# Patient Record
Sex: Male | Born: 1950 | Race: White | Hispanic: No | Smoking: Never smoker
Health system: Southern US, Community
[De-identification: ages and names within clinical notes are randomized; demographics above are authoritative.]

## PROBLEM LIST (undated history)

## (undated) DIAGNOSIS — M179 Osteoarthritis of knee, unspecified: Secondary | ICD-10-CM

## (undated) DIAGNOSIS — C61 Malignant neoplasm of prostate: Secondary | ICD-10-CM

## (undated) DIAGNOSIS — N400 Enlarged prostate without lower urinary tract symptoms: Secondary | ICD-10-CM

## (undated) DIAGNOSIS — M1710 Unilateral primary osteoarthritis, unspecified knee: Secondary | ICD-10-CM

## (undated) DIAGNOSIS — E785 Hyperlipidemia, unspecified: Secondary | ICD-10-CM

## (undated) DIAGNOSIS — K644 Residual hemorrhoidal skin tags: Secondary | ICD-10-CM

## (undated) DIAGNOSIS — I10 Essential (primary) hypertension: Secondary | ICD-10-CM

## (undated) HISTORY — DX: Osteoarthritis of knee, unspecified: M17.9

## (undated) HISTORY — PX: TONSILLECTOMY AND ADENOIDECTOMY: SUR1326

## (undated) HISTORY — DX: Residual hemorrhoidal skin tags: K64.4

## (undated) HISTORY — DX: Essential (primary) hypertension: I10

## (undated) HISTORY — DX: Benign prostatic hyperplasia without lower urinary tract symptoms: N40.0

## (undated) HISTORY — DX: Unilateral primary osteoarthritis, unspecified knee: M17.10

## (undated) HISTORY — PX: JOINT REPLACEMENT: SHX530

## (undated) HISTORY — PX: PROSTATE BIOPSY: SHX241

## (undated) HISTORY — PX: OTHER SURGICAL HISTORY: SHX169

## (undated) HISTORY — DX: Hyperlipidemia, unspecified: E78.5

---

## 2004-07-23 HISTORY — PX: OTHER SURGICAL HISTORY: SHX169

## 2006-03-08 ENCOUNTER — Ambulatory Visit (HOSPITAL_COMMUNITY): Admission: RE | Admit: 2006-03-08 | Discharge: 2006-03-08 | Payer: Self-pay | Admitting: Neurosurgery

## 2007-07-30 ENCOUNTER — Ambulatory Visit: Payer: Self-pay | Admitting: Family Medicine

## 2007-07-30 DIAGNOSIS — Z87448 Personal history of other diseases of urinary system: Secondary | ICD-10-CM | POA: Insufficient documentation

## 2007-07-30 DIAGNOSIS — M1990 Unspecified osteoarthritis, unspecified site: Secondary | ICD-10-CM | POA: Insufficient documentation

## 2007-07-30 LAB — CONVERTED CEMR LAB
ALT: 28 units/L (ref 0–53)
AST: 30 units/L (ref 0–37)
Basophils Relative: 0.3 % (ref 0.0–1.0)
Bilirubin, Direct: 0.2 mg/dL (ref 0.0–0.3)
CO2: 30 meq/L (ref 19–32)
Calcium: 9.8 mg/dL (ref 8.4–10.5)
Chloride: 103 meq/L (ref 96–112)
Creatinine, Ser: 0.9 mg/dL (ref 0.4–1.5)
Eosinophils Relative: 4.8 % (ref 0.0–5.0)
GFR calc Af Amer: 112 mL/min
Glucose, Bld: 93 mg/dL (ref 70–99)
HDL: 43.9 mg/dL (ref >39.0)
Lymphocytes Relative: 29.1 % (ref 12.0–46.0)
Neutro Abs: 3.3 10*3/uL (ref 1.4–7.7)
Platelets: 253 10*3/uL (ref 150–400)
Total Bilirubin: 1.3 mg/dL — ABNORMAL HIGH (ref 0.3–1.2)
Total Protein: 7.1 g/dL (ref 6.0–8.3)
VLDL: 29 mg/dL (ref 0–40)
WBC: 6.1 10*3/uL (ref 4.5–10.5)

## 2007-08-04 ENCOUNTER — Telehealth: Payer: Self-pay | Admitting: Family Medicine

## 2007-08-05 ENCOUNTER — Ambulatory Visit: Payer: Self-pay | Admitting: Internal Medicine

## 2007-08-19 ENCOUNTER — Ambulatory Visit: Payer: Self-pay | Admitting: Internal Medicine

## 2007-08-19 ENCOUNTER — Encounter: Payer: Self-pay | Admitting: Family Medicine

## 2007-08-19 DIAGNOSIS — K644 Residual hemorrhoidal skin tags: Secondary | ICD-10-CM | POA: Insufficient documentation

## 2007-11-10 ENCOUNTER — Ambulatory Visit: Payer: Self-pay | Admitting: Family Medicine

## 2007-11-10 LAB — CONVERTED CEMR LAB
Bilirubin, Direct: 0.1 mg/dL (ref 0.0–0.3)
LDL Cholesterol: 112 mg/dL — ABNORMAL HIGH (ref 0–99)
Total Bilirubin: 1 mg/dL (ref 0.3–1.2)
Total CHOL/HDL Ratio: 4.9
VLDL: 30 mg/dL (ref 0–40)

## 2007-12-09 ENCOUNTER — Telehealth: Payer: Self-pay | Admitting: Family Medicine

## 2009-01-31 ENCOUNTER — Ambulatory Visit: Payer: Self-pay | Admitting: Internal Medicine

## 2009-02-02 ENCOUNTER — Encounter (INDEPENDENT_AMBULATORY_CARE_PROVIDER_SITE_OTHER): Payer: Self-pay | Admitting: *Deleted

## 2009-02-02 LAB — CONVERTED CEMR LAB
Alkaline Phosphatase: 62 units/L (ref 39–117)
BUN: 18 mg/dL (ref 6–23)
Bilirubin, Direct: 0.1 mg/dL (ref 0.0–0.3)
Chloride: 107 meq/L (ref 96–112)
Cholesterol: 208 mg/dL — ABNORMAL HIGH (ref 0–200)
Direct LDL: 145.6 mg/dL
Eosinophils Relative: 4 % (ref 0.0–5.0)
Glucose, Bld: 95 mg/dL (ref 70–99)
HCT: 43.3 % (ref 39.0–52.0)
Hemoglobin, Urine: NEGATIVE
Hemoglobin: 14.9 g/dL (ref 13.0–17.0)
Lymphs Abs: 1.5 10*3/uL (ref 0.7–4.0)
MCV: 90.4 fL (ref 78.0–100.0)
Monocytes Absolute: 0.6 10*3/uL (ref 0.1–1.0)
Neutro Abs: 3.1 10*3/uL (ref 1.4–7.7)
Nitrite: NEGATIVE
Platelets: 204 10*3/uL (ref 150.0–400.0)
Potassium: 4.3 meq/L (ref 3.5–5.1)
RDW: 12.8 % (ref 11.5–14.6)
Sodium: 144 meq/L (ref 135–145)
Specific Gravity, Urine: 1.015 (ref 1.000–1.030)
Total Bilirubin: 1.1 mg/dL (ref 0.3–1.2)
Total CHOL/HDL Ratio: 5
Total Protein, Urine: NEGATIVE mg/dL
Total Protein: 6.9 g/dL (ref 6.0–8.3)
Urine Glucose: NEGATIVE mg/dL
VLDL: 29.2 mg/dL (ref 0.0–40.0)
WBC: 5.4 10*3/uL (ref 4.5–10.5)
pH: 7 (ref 5.0–8.0)

## 2010-06-26 ENCOUNTER — Encounter: Payer: Self-pay | Admitting: Internal Medicine

## 2010-06-26 ENCOUNTER — Ambulatory Visit: Payer: Self-pay | Admitting: Internal Medicine

## 2010-08-22 LAB — URINALYSIS, ROUTINE W REFLEX MICROSCOPIC
Bilirubin Urine: NEGATIVE
Hgb urine dipstick: NEGATIVE
Nitrite: NEGATIVE
Specific Gravity, Urine: 1.017 (ref 1.005–1.030)
Urobilinogen, UA: 0.2 mg/dL (ref 0.0–1.0)
pH: 7 (ref 5.0–8.0)

## 2010-08-22 LAB — DIFFERENTIAL
Basophils Absolute: 0 10*3/uL (ref 0.0–0.1)
Lymphocytes Relative: 30 % (ref 12–46)
Lymphs Abs: 2 10*3/uL (ref 0.7–4.0)
Neutro Abs: 3.4 10*3/uL (ref 1.7–7.7)
Neutrophils Relative %: 52 % (ref 43–77)

## 2010-08-22 LAB — BASIC METABOLIC PANEL
BUN: 15 mg/dL (ref 6–23)
Calcium: 9.7 mg/dL (ref 8.4–10.5)
GFR calc non Af Amer: >60 mL/min (ref >60)
Glucose, Bld: 87 mg/dL (ref 70–99)
Potassium: 4.5 mEq/L (ref 3.5–5.1)
Sodium: 140 mEq/L (ref 135–145)

## 2010-08-22 LAB — SURGICAL PCR SCREEN: Staphylococcus aureus: NEGATIVE

## 2010-08-22 LAB — CBC
HCT: 45.4 % (ref 39.0–52.0)
Hemoglobin: 15.8 g/dL (ref 13.0–17.0)
MCV: 87.3 fL (ref 78.0–100.0)
RBC: 5.2 MIL/uL (ref 4.22–5.81)
RDW: 12.5 % (ref 11.5–15.5)
WBC: 6.5 10*3/uL (ref 4.0–10.5)

## 2010-08-22 LAB — PROTIME-INR
INR: 1.01 (ref 0.00–1.49)
Prothrombin Time: 13.5 seconds (ref 11.6–15.2)

## 2010-08-22 LAB — APTT: aPTT: 35 seconds (ref 24–37)

## 2010-08-22 NOTE — Letter (Signed)
Summary: Surgical clearance/[facility]  Surgical clearance/[facility]   Imported By: Lester [place] 06/28/2010 11:05:23  _____________________________________________________________________  External Attachment:    Type:   Image     Comment:   External Document

## 2010-08-22 NOTE — Assessment & Plan Note (Signed)
Summary: pre - op clearance/#/cd   Vital Signs:  Patient profile:   60 year old male Height:      69 inches (175.26 cm) Weight:      245.8 pounds (111.73 kg) BMI:     36.43 O2 Sat:      95 % on Room air Temp:     97.4 degrees F (36.33 degrees C) oral Pulse rate:   79 / minute BP sitting:   126 / 84  (left arm) Cuff size:   large  Vitals Entered By: Orlan Leavens RMA (June 26, 2010 8:18 AM)  O2 Flow:  Room air CC: Medical clearance Is Patient Diabetic? No Pain Assessment Patient in pain? no        Primary Care Provider:  Newt Lukes MD  CC:  Medical clearance.  History of Present Illness: consult request from dr. Thomasena Edis at [facility] - here for medical clearance -  pending B TKR by dr. Charlann Boxer in next 1-2 months no CP or SOB or edema - no DOE no hx CAD or CKD; no DM pt able to walk 2 blocks without fatigue (but limited by pain in knees, esp if climbing stairs)  also reviewed chronic med issues - 1) HTN - reports compliance with ongoing medical treatment and no changes in medication dose or frequency. denies adverse side effects related to current therapy.   2) dyslipidemia - reports compliance with ongoing medical treatment and no changes in medication dose or frequency. denies adverse side effects related to current therapy.   Preventive Screening-Counseling & Management  Alcohol-Tobacco     Alcohol drinks/day: 0     Alcohol Counseling: not indicated; patient does not drink     Smoking Status: never     Tobacco Counseling: not indicated; no tobacco use  Clinical Review Panels:  Lipid Management   Cholesterol:  208 (01/31/2009)   LDL (bad choesterol):  112 (11/10/2007)   HDL (good cholesterol):  41.20 (01/31/2009)  CBC   WBC:  5.4 (01/31/2009)   RBC:  4.79 (01/31/2009)   Hgb:  14.9 (01/31/2009)   Hct:  43.3 (01/31/2009)   Platelets:  204.0 (01/31/2009)   MCV  90.4 (01/31/2009)   MCHC  34.4 (01/31/2009)   RDW  12.8 (01/31/2009)   PMN:   56.0 (01/31/2009)   Lymphs:  28.5 (01/31/2009)   Monos:  10.8 (01/31/2009)   Eosinophils:  4.0 (01/31/2009)   Basophil:  0.7 (01/31/2009)  Complete Metabolic Panel   Glucose:  95 (01/31/2009)   Sodium:  144 (01/31/2009)   Potassium:  4.3 (01/31/2009)   Chloride:  107 (01/31/2009)   CO2:  28 (01/31/2009)   BUN:  18 (01/31/2009)   Creatinine:  0.7 (01/31/2009)   Albumin:  4.1 (01/31/2009)   Total Protein:  6.9 (01/31/2009)   Calcium:  9.0 (01/31/2009)   Total Bili:  1.1 (01/31/2009)   Alk Phos:  62 (01/31/2009)   SGPT (ALT):  28 (01/31/2009)   SGOT (AST):  27 (01/31/2009)   Current Medications (verified): 1)  Glucosamine 1500 Complex  Caps (Glucosamine-Chondroit-Vit C-Mn) .... Take 2 By Mouth Qd 2)  Vitamin C 500 Mg Tabs (Ascorbic Acid) .... Take 1 By Mouth Qd 3)  Fish Oil 1000 Mg Caps (Omega-3 Fatty Acids) .... Take 1 By Mouth Qd 4)  Aspirin 81 Mg Tbec (Aspirin) .... Take 1 By Mouth Qd 5)  Ibuprofen 200 Mg Tabs (Ibuprofen) .... Use Prn 6)  Prosteon  Tabs (Multiple Minerals-Vitamins) .... Take 3 By Mouth Qd  7)  Saw Palmetto 80 Mg Caps (Saw Palmetto (Serenoa Repens)) .... Take 1 By Mouth Qd 8)  Pumpkin Seed Oil  Caps (Misc Natural Products) .... Take 1 By Mouth Qd 9)  Lycopene  Caps (Lycopene) .... Take 1 By Mouth Qd 10)  Zocor 20 Mg Tabs (Simvastatin) .... Take 1 By Mouth Qd  Allergies (verified): No Known Drug Allergies  Past History:  Past medical, surgical, family and social histories (including risk factors) reviewed, and no changes noted (except as noted below).  Past Medical History:  Hyperlipidemia Hypertension  Past Surgical History: Tonsilectomy & Adnoids Neck fusion (05 & 07) cartilage surgery right knee (2006)  Family History: Reviewed history from 01/31/2009 and no changes required. ADOPTED NO FAM HX  Social History: Reviewed history from 07/30/2007 and no changes required. ADOPTED Married Never Smoked Retired  Review of Systems       see HPI  above. I have reviewed all other systems and they were negative.   Physical Exam  General:  overweight-appearing.  alert, well-developed, well-nourished, and cooperative to examination.    Head:  Normocephalic and atraumatic without obvious abnormalities. No apparent alopecia or balding. Eyes:  vision grossly intact; pupils equal, round and reactive to light.  conjunctiva and lids normal.    Neck:  thick, supple, full ROM, no masses, no thyromegaly; no thyroid nodules or tenderness. no JVD or carotid bruits.   Lungs:  normal respiratory effort, no intercostal retractions or use of accessory muscles; normal breath sounds bilaterally - no crackles and no wheezes.    Heart:  normal rate, regular rhythm, no murmur, and no rub. BLE without edema.  Abdomen:  protuberant but soft, non-tender, normal bowel sounds, no distention; no masses and no appreciable hepatomegaly or splenomegaly.   Msk:  B knee: decreased range of motion, diffuse boggy synovitis. Tender to palpation on joint line. Increased pain with weight bearing. Positive crepitus.  Neurologic:  alert & oriented X3 and cranial nerves II-XII symetrically intact.  strength normal in all extremities, sensation intact to light touch, and gait normal. speech fluent without dysarthria or aphasia; follows commands with good comprehension.  Skin:  no rashes, vesicles, ulcers, or erythema. No nodules or irregularity to palpation.  Psych:  Oriented X3, memory intact for recent and remote, normally interactive, good eye contact, not anxious appearing, not depressed appearing, and not agitated.      Impression & Recommendations:  Problem # 1:  PREOPERATIVE EXAMINATION (ICD-V72.84) This patient has been evaluated and it is felt that the surgical risk is low and outweighed by the potential benefit of the surgery. Therefore, medically clear to proceed when scheduling allows. defer preop labs to anesthesia - prev labs have been normal and pt is  asymptomatic  Problem # 2:  HYPERLIPIDEMIA (ICD-272.4) needs updated FLP - not fasting at this time - refills provided and encouraged to make plans for cpx soon His updated medication list for this problem includes:    Zocor 20 Mg Tabs (Simvastatin) .Marland Kitchen... Take 1 by mouth qd  Labs Reviewed: SGOT: 27 (01/31/2009)   SGPT: 28 (01/31/2009)   HDL:41.20 (01/31/2009), 36.6 (11/10/2007)  LDL:112 (11/10/2007), DEL (07/30/2007)  Chol:208 (01/31/2009), 179 (11/10/2007)  Trig:146.0 (01/31/2009), 151 (11/10/2007)  Problem # 3:  HYPERTENSION (ICD-401.9)  Orders: EKG w/ Interpretation (93000)  BP today: 126/84 Prior BP: 122/82 (01/31/2009)  Labs Reviewed: K+: 4.3 (01/31/2009) Creat: : 0.7 (01/31/2009)   Chol: 208 (01/31/2009)   HDL: 41.20 (01/31/2009)   LDL: 112 (11/10/2007)   TG: 146.0 (  01/31/2009)  Complete Medication List: 1)  Glucosamine 1500 Complex Caps (Glucosamine-chondroit-vit c-mn) .... Take 2 by mouth qd 2)  Vitamin C 500 Mg Tabs (Ascorbic acid) .... Take 1 by mouth qd 3)  Fish Oil 1000 Mg Caps (Omega-3 fatty acids) .... Take 1 by mouth qd 4)  Aspirin 81 Mg Tbec (Aspirin) .... Take 1 by mouth qd 5)  Ibuprofen 200 Mg Tabs (Ibuprofen) .... Use prn 6)  Prosteon Tabs (Multiple minerals-vitamins) .... Take 3 by mouth qd 7)  Saw Palmetto 80 Mg Caps (Saw palmetto (serenoa repens)) .... Take 1 by mouth qd 8)  Pumpkin Seed Oil Caps (Misc natural products) .... Take 1 by mouth qd 9)  Lycopene Caps (Lycopene) .... Take 1 by mouth qd 10)  Zocor 20 Mg Tabs (Simvastatin) .... Take 1 by mouth qd  Patient Instructions: 1)  You have been evaluated and it is felt that the surgical risk is low and outweighed by the potential benefit of the surgery. Therefore, you medically clear to proceed when scheduling allows.  We will let [facility] know this also - copy of this note and EKG + [place] [facility] form faxed to Dr. Thomasena Edis c/o Natasha Mead at [phone] 2)  refill on your cholesterol medication today 3)   Please schedule a follow-up appointment for complete physical and labs before July 2012, call sooner if problems.  Prescriptions: ZOCOR 20 MG TABS (SIMVASTATIN) take 1 by mouth qd  #30 x 6   Entered and Authorized by:   Newt Lukes MD   Signed by:   Newt Lukes MD on 06/26/2010   Method used:   Electronically to        [facility]  [phone]* (retail)       [address] [place], [place]  [address]       Ph: [phone] or [phone]       Fax: [phone]   RxID:   [id number]    Orders Added: 1)  EKG w/ Interpretation [93000] 2)  Consultation Level III [06301]

## 2010-08-23 HISTORY — PX: TOTAL KNEE ARTHROPLASTY: SHX125

## 2010-08-29 ENCOUNTER — Inpatient Hospital Stay (HOSPITAL_COMMUNITY)
Admission: RE | Admit: 2010-08-29 | Discharge: 2010-09-01 | DRG: 462 | Disposition: A | Discharge status: Home Health | Payer: Managed Care, Other (non HMO) | Attending: Orthopedic Surgery | Admitting: Orthopedic Surgery

## 2010-08-29 DIAGNOSIS — Z981 Arthrodesis status: Secondary | ICD-10-CM

## 2010-08-29 DIAGNOSIS — E7800 Pure hypercholesterolemia, unspecified: Secondary | ICD-10-CM | POA: Diagnosis present

## 2010-08-29 DIAGNOSIS — M1710 Unilateral primary osteoarthritis, unspecified knee: Principal | ICD-10-CM | POA: Diagnosis present

## 2010-08-30 LAB — CBC
HCT: 33.6 % — ABNORMAL LOW (ref 39.0–52.0)
Hemoglobin: 11.8 g/dL — ABNORMAL LOW (ref 13.0–17.0)
MCH: 30.6 pg (ref 26.0–34.0)
MCHC: 35.1 g/dL (ref 30.0–36.0)
MCV: 87.3 fL (ref 78.0–100.0)
Platelets: 207 10*3/uL (ref 150–400)
RBC: 3.85 MIL/uL — ABNORMAL LOW (ref 4.22–5.81)
RDW: 12.6 % (ref 11.5–15.5)
WBC: 11.9 10*3/uL — ABNORMAL HIGH (ref 4.0–10.5)

## 2010-08-30 LAB — BASIC METABOLIC PANEL
BUN: 12 mg/dL (ref 6–23)
CO2: 26 mEq/L (ref 19–32)
Chloride: 103 mEq/L (ref 96–112)
Creatinine, Ser: 0.94 mg/dL (ref 0.4–1.5)
Glucose, Bld: 154 mg/dL — ABNORMAL HIGH (ref 70–99)
Potassium: 4.4 mEq/L (ref 3.5–5.1)

## 2010-08-31 LAB — CBC
HCT: 30.3 % — ABNORMAL LOW (ref 39.0–52.0)
MCH: 30.4 pg (ref 26.0–34.0)
MCHC: 34.3 g/dL (ref 30.0–36.0)
MCV: 88.6 fL (ref 78.0–100.0)
RDW: 12.9 % (ref 11.5–15.5)

## 2010-08-31 LAB — BASIC METABOLIC PANEL
BUN: 10 mg/dL (ref 6–23)
Calcium: 8.2 mg/dL — ABNORMAL LOW (ref 8.4–10.5)
Creatinine, Ser: 0.88 mg/dL (ref 0.4–1.5)
GFR calc non Af Amer: >60 mL/min (ref >60)
Glucose, Bld: 110 mg/dL — ABNORMAL HIGH (ref 70–99)
Potassium: 4.5 mEq/L (ref 3.5–5.1)

## 2010-09-01 LAB — BASIC METABOLIC PANEL
Calcium: 8.7 mg/dL (ref 8.4–10.5)
GFR calc non Af Amer: >60 mL/min (ref >60)
Glucose, Bld: 109 mg/dL — ABNORMAL HIGH (ref 70–99)
Sodium: 142 mEq/L (ref 135–145)

## 2010-09-01 LAB — CBC
HCT: 30.9 % — ABNORMAL LOW (ref 39.0–52.0)
MCHC: 34 g/dL (ref 30.0–36.0)
Platelets: 192 10*3/uL (ref 150–400)
RDW: 13.1 % (ref 11.5–15.5)

## 2010-09-03 NOTE — H&P (Signed)
  Jeffrey Romero, LINDAHL              ACCOUNT NO.:  [id number]  MEDICAL RECORD NO.:  [id number]          PATIENT TYPE:  LOCATION:                                 FACILITY:  PHYSICIAN:  Madlyn Frankel. Charlann Boxer, M.D.  DATE OF BIRTH:  [birth year]  DATE OF ADMISSION: DATE OF DISCHARGE:                             HISTORY & PHYSICAL   ADMISSION DIAGNOSIS:  Bilateral knee osteoarthritis.  BRIEF HISTORY:  This is a patient who was seen through Dr. Hayden Rasmussen' clinic here in our practice and was referred over here after failed conservative treatment including injections and oral medications, for consideration of bilateral knee arthroplasties.  After review with Dr. Charlann Boxer, he is going to proceed with bilateral knee arthroplasties on February 7.  PAST MEDICAL HISTORY:  Benign.  He has only got a remote history of high cholesterol.  He does have a history of 2 cervical fusions in the past in 2005 and 2007 and the bilateral knee osteoarthritis.  His only surgery history is the 2 neck fusions.  MEDICATION LIST:  Zocor daily and vitamins and fish oil daily.  He has no medicine allergies.  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  He is married.  No history of tobacco use.  Alcoholic beverages socially.  No history of drug abuse.  FAMILY HISTORY:  Benign.  He was adopted.  He has 1 child.  REVIEW OF SYSTEMS:  Notable for those difficulties described in history present illness.  Past medical history and review of systems sheets are otherwise unremarkable.  PHYSICAL EXAMINATION:  VITAL SIGNS:  The patient is 5 feet 9, 246 pounds, blood pressure today is 130/97, respirations 20, pulse 68. GENERAL:  Health is excellent. HEENT:  Shows him to be normocephalic.  He does wear glasses. HEAD AND NECK:  Unremarkable. LUNGS:  Clear to auscultation bilaterally. HEART:  S1, S2.  There are no murmurs, rubs or gallops.  He has a remote history of high  cholesterol. ABDOMEN:  Soft, nondistended. GI/GU:  Otherwise unremarkable. EXTREMITY EXAM:  Bilateral knee osteoarthritis. DERMATOLOGICAL/NEUROLOGICAL:  Intact.  His labs, EKG and chest x-ray are pending through [facility].  IMPRESSION:  Bilateral knee osteoarthritis.  PLAN:  He will be admitted on February 7 for bilateral knee total arthroplasties.  His discharge medications include MiraLax, Colace, iron, Robaxin and Xarelto were given to him today.  His pain medicine will be given to him at discharge.     Russell L. Webb Silversmith, RN   ______________________________ Madlyn Frankel Charlann Boxer, M.D.    RLW/MEDQ  D:  08/10/2010  T:  08/10/2010  Job:  [id number]  Electronically Signed by Lauree Chandler NP-C on 08/30/2010 09:44:38 AM Electronically Signed by Durene Romans M.D. on 09/03/2010 09:17:45 AM

## 2010-09-03 NOTE — Op Note (Signed)
Jeffrey Romero, BRUNSMAN              ACCOUNT NO.:  [id number]  MEDICAL RECORD NO.:  [id number]           PATIENT TYPE:  I  LOCATION:  1610                         FACILITY:  [facility]  PHYSICIAN:  Madlyn Frankel. Charlann Boxer, M.D.  DATE OF BIRTH:  1951/06/23  DATE OF PROCEDURE:  08/29/2010 DATE OF DISCHARGE:                              OPERATIVE REPORT   PREOPERATIVE DIAGNOSIS:  Bilateral knee osteoarthritis.  POSTOPERATIVE DIAGNOSIS:  Bilateral knee osteoarthritis.  PROCEDURE:  Bilateral total knee replacement.  COMPONENTS USED:  Left total knee replacement had a rotating platform posterior stabilized knee system, size 4 femur, 4 tibia, 10-mm insert and 41 patellar button.  The right knee had a 4 femur, 4 tibia and a 12.5-mm insert and a 41 patellar button.  SURGEON:  Madlyn Frankel. Charlann Boxer, M.D.  ASSISTANT:  Kallie Edward, nurse practitioner  ANESTHESIA:  Preoperative epidural catheter placement followed by general anesthesia.  BLOOD LOSS:  Minimal.  TOURNIQUET TIME:  39 minutes at 250 mmHg for each knee.  DRAINS:  One Hemovac per knee.  COMPLICATIONS:  None.  INDICATIONS OF THE PROCEDURE:  Jeffrey Romero is a 60 year old gentleman who was seen at the request of referring physician for consideration of bilateral simultaneous arthroplasty.  Radiographs revealed end-stage degenerative changes that he had failed respond to conservatively.  His quality of life was diminished, he wished to return back to his functional quality life including work as soon as possible and subsequently the reason for his evaluation.  After reviewing the risks of infection, DVT, component failure, need for revision surgery and postoperative course and expectations in the setting with the increased risk associated with bilateral procedures, he wished to proceed in this fashion.  Consent was obtained for benefit of pain relief.  PROCEDURE IN DETAIL:  The patient was brought to operative theater. Once adequate  anesthesia had been established, preoperative antibiotics, Ancef 2 g administered, the patient was positioned supine.  Bilateral lower extremities then placed in the proximal thigh tourniquets.  Both lower extremities were then prepped and draped in sterile fashion using a dual extremity set up.  At this point, a time-out was performed identifying the patient, the planned procedure and the extremities.  The planned procedure was started with the left knee than the right.  Both knees were placed in the Mayo leg holder.  The left lower extremity was then exsanguinated, tourniquet elevated to 250 mmHg.  Midline incision was made followed by median arthrotomy following initial exposure and debridement.  Attention was directed to patella.  Precut measurement was noted to be about 26 mm.  I resected down to about 15 mm, used a 41 patellar button to restore height.  Lug holes were drilled and metal shim placed to protect the patella from retractors and saw blades.  Following this, the femoral canal was opened with a drill, irrigated to try to prevent fat emboli and intramedullary rod was passed and 5 degrees of valgus, 10 mm of bone was resected off the distal femur.  Following this resection, the tibia subluxated anteriorly, meniscus and cruciate stumps debrided.  Using extramedullary guide, I resected 10-mm bone off the base  of the proximal lateral tibia using extramedullary guide.  Confirmed at this point that the knee was going to come to full extension with a 10-mm spacer block, stable medial and lateral collateral ligaments.  I also confirmed the tibial cut was perpendicular in the coronal plane.  Following this, I returned to the femur, femoral sized and determined a size 4 component.  The rotation was based off the proximal tibial cut.  The rotation block was pinned into position using a C clamp.  The 4-in-1 cutting block was then pinned into the blades.  The anterior-posterior  chamfer cuts were then made without difficulty, no notching.  The final box cut was made off the lateral aspect of the distal femur. Following this, attention was redirected back to tibia.  Cut surface was best fit with a size 4 tibial tray, it was pinned into position, drilled and keel punched.  At this point, trial reduction was carried out with a 4 femur, 4 tibia and 10-mm insert.  The trial components were now removed.  The knee was injected with 0.25 Marcaine with epinephrine and 1 cc of Toradol, total of 31 cc.  The final components were opened.  The knee was irrigated with normal saline solution, pulse lavaged.  Final components were then cemented on the clean and dried cut surface with the bone in knee was brought to extension with a 10-mm insert.  Extruded cement was removed.  Once the cement had fully cured and excessive cement was removed throughout the knee, the final 10-mm insert was selected and placed in the knee.  The tourniquet had been let down at 39 minutes.  There was no significant hemostasis, required a medium Hemovac drain, was placed deep.  At this point, I reapproximated the extensor mechanism using #1 Vicryl with the knee in flexion.  The remaining of the wound was closed with 2-0 Vicryl and running 4-0 Monocryl.  The knee was cleaned, dried and dressed sterilely with Dermabond and Aquacel dressing.  At this point, we provisionally covered this left knee in attendance of the right knee.  The right knee was prepared in similar fashion.  At this point, a separate time-out was performed identifying the patient, planned procedure and this procedure on the right extremity.  The right lower extremity was exsanguinated, tourniquet elevated to 250 mmHg, a midline incision was made followed by median parapatellar arthrotomy following initial exposure and debridement.  Attention was directed to patella, precut measure was 26 mm.  I resected down to 14-15 mm and used a  41 patellar button again.  Lug holes were drilled and metal shim placed to protect the patella from retractor and saw blades.  Attention was now directed to the femur.  Femoral preparation was carried out.  The femoral canal was opened with the drill, irrigated try to prevent fat emboli.  The intramedullary rod was passed and again at 5 degrees of valgus for this right knee, a 10-mm bone was resected off the distal femur.  The tibia was then subluxated anteriorly and using extramedullary guide, I resected 10-mm bone at the proximal lateral tibia.  Again, confirmed the knee was going to be stable in extension with release of 10-mm insert.  Following this confirmation, I confirmed that the cut was perpendicular in the coronal plane.  We sized the femur to be a size 4.  I then pinned the rotation block in a place based off the proximal tibial cut with a C clamp.  The 4-in-1 cutting block  was then placed, anterior-posterior chamfer cuts were then made without difficulty or notching.  Box cut was made off the lateral aspect of the distal femur.  Tibia subluxated anteriorly and cut surface was best fit again with a size 4 and size 4 tray was pinned in position, drilled and keel punch.  Trial reduction was carried in 4 femur, 4 tibia and at this point used a 12.5-mm insert, seemed stable with extension and through flexion.  The patella tracked through the trochlea without application of pressure.  At this point, all trial components were removed.  Again, the final components were opened, holding polyethylene insert and the knee was injected with 30 cc of 0.25% Marcaine with epinephrine and 1 mL of Toradol.  The knee was irrigated with normal saline and solution and cement mixed.  Final components were cemented in position with the knee brought to extension with a 12.5 insert. Extruded cement was removed.  Once the cement had fully cured and excessive cement was removed throughout the knee, I  selected the 12.5 insert and it was placed.  The knee was re-irrigated.  Tourniquet had been let down again at 39 minutes at 250 mmHg.  The extensor mechanism was then reapproximated.  We closed Hemovac drain with #1 Vicryl with the knee in flexion.  The remainder of the wound was closed with 2-0 Vicryl and running 4-0 Monocryl.  The knee was cleaned, dried and dressed sterilely with Dermabond and Aquacel dressing.  The patient's both knees were then at this point wrapped with Ace wraps.  The patient was then extubated and brought to the recovery room in stable condition tolerating these procedures without complication.     Madlyn Frankel Charlann Boxer, M.D.     MDO/MEDQ  D:  08/29/2010  T:  08/30/2010  Job:  [id number]  Electronically Signed by Durene Romans M.D. on 09/03/2010 09:17:52 AM

## 2010-09-11 NOTE — Discharge Summary (Signed)
  Jeffrey Romero, Jeffrey Romero              ACCOUNT NO.:  [id number]  MEDICAL RECORD NO.:  [id number]           PATIENT TYPE:  I  LOCATION:  1610                         FACILITY:  [facility]  PHYSICIAN:  Madlyn Frankel. Charlann Boxer, M.D.  DATE OF BIRTH:  08/12/1950  DATE OF ADMISSION:  08/29/2010 DATE OF DISCHARGE:  09/01/2010                              DISCHARGE SUMMARY   ADMITTING DIAGNOSIS:  Bilateral knee osteoarthritis.  BRIEF HISTORY:  This is a patient who was seen through Dr. Mardelle Matte Collin's clinic in our practice and was referred over for failed conservative treatment and decided to proceed with bilateral knee arthroplasties with Dr. Charlann Boxer.  HOSPITAL COURSE:  The patient was admitted on February 7 through same- day surgery for bilateral knee arthroplasties.  He was taken to the operating theater and underwent the bilateral arthroplasties without any difficulties.  He was taken to the PACU for recovery and brought to [facility]- [place] for further recovery and rehabilitation.  DISCHARGE DIAGNOSES: 1. Bilateral knee osteoarthritis with resulting arthroplasties. 2. High cholesterol.  PAST SURGICAL HISTORY:  He has a surgical history of two cervical fusions in the past as well as the arthroplasties.  CONDITION ON DISCHARGE:  Good.  DISCHARGE INSTRUCTIONS:  Discharge instructions were given to the patient.  He has bilateral Aquacel dressings in place.  He knows that he can remove these after 8 days but he can shower.  Otherwise he needs to keep the wound dry and clean.  He will work with home health physical therapy.  FOLLOWUP:  He will follow up with Dr. Charlann Boxer in two weeks.  DISCHARGE MEDICATIONS: 1. Acetaminophen 325 mg every 4 hours as needed. 2. Ferrous sulfate 325 mg three times a day. 3. Colace 100 mg as needed. 4. Hydrocodone 7.5/325 one to two p.o. q.4-6 hours p.r.n. pain. 5. Robaxin 500 mg every 6 hours as needed. 6. Xarelto 10 mg a day for 10 days. 7. Fish oil daily. 8. Ibuprofen as  needed. 9. Multivitamins as needed. 10.Simvastatin 20 mg a day.     Russell L. Webb Silversmith, RN   ______________________________ Madlyn Frankel Charlann Boxer, M.D.    RLW/MEDQ  D:  09/01/2010  T:  09/01/2010  Job:  [id number]  Electronically Signed by Lauree Chandler NP-C on 09/04/2010 02:54:36 PM Electronically Signed by Durene Romans M.D. on 09/11/2010 10:48:25 AM

## 2010-12-08 NOTE — Op Note (Signed)
Jeffrey Romero, Jeffrey Romero              ACCOUNT NO.:  [id number]   MEDICAL RECORD NO.:  [id number]          PATIENT TYPE:  AMB   LOCATION:  SDS                          FACILITY:  [facility]   PHYSICIAN:  Henry A. Pool, M.D.    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  Status post C4-5 anterior cervical diskectomy and fusion with allograft and  anterior plating.   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  Status post C4-5 anterior cervical diskectomy and fusion with allograft and  anterior plating.   PROCEDURE NOTE:  Re-exploration of C4-C6 anterior fusion.  Removal anterior  plate instrumentation C4-C6.  C6-7 anterior cervical diskectomy and fusion  with allograft and anterior plating.   SURGEON:  Kathaleen Maser. Pool, M.D.   ASSISTANT:  Reinaldo Meeker, M.D.   ANESTHESIA:  General endotracheal.   INDICATIONS:  Jeffrey Romero is a 60 year old male who is status post previous  C4-C6 ACDF with allograft plating by an outside physician.  The patient  presents now with neck and left upper extremity pain, paresthesias and  weakness consistent with a left-sided C7 radiculopathy.  Patient has failed  conservative management.  Workup demonstrates evidence of a leftward C6-7  disk herniation with compression left-sided T7 nerve root.  We discussed  options of management with possible undergoing  C6-7 anterior cervical  diskectomy and fusion, allograft and anterior plating.  This would require  re-exploration of his fusion at C4-C6 as well as removal of his anterior  plate hardware.  Patient aware of the risks and benefits wished to proceed.   OPERATIVE NOTE:  Patient taken operating room, placed operating table supine  position, adequate level anesthesia was achieved.  The patient positioned  supine with  his neck slightly extended, held in place with halter traction.  The patient's anterior cervical region was prepped and draped sterilely.  10  blade was used to make a linear skin incision overlying the C6 vertebral  level.  This was carried down sharply to the platysma.  The platysma was  then divided vertically.   Proceeded along medial border of the sternocleidomastoid muscle and carotid  sheath.  Trachea and esophagus were mobilized and retracted toward the left.  Prevertebral fascia stripped off the anterior spinal column.  Longus colli  muscle was then elevated bilaterally using electrocautery.  Patient's  previous anterior plate instrumentation was uncovered.  This was an Aesculap  ABC plate.  Attempts to remove the interval locking screw from the bone  screw were unsuccessful.  These interval locking screws had basically become  fused to the outer screws.  After struggling with this for a while we  decided to drill of the screw head using the carbide drill bit.  The plate  was then able to be removed.  Three of the four screws were then able to be  removed.  The other screw was drilled flush with the surrounding bone and  left in  place.  Disk space at C6-7 was then incised with a 15 blade in  rectangular fashion.  Wide disk space clean out was then achieved using  pituitary rongeurs, forward and backward angled Carlens curettes, Kerrison  rongeurs and a high speed drill.  All elements of the disk were removed down  to the level of the posterior annulus.  Microscope was then brought to the  field and was used throughout the remainder of the diskectomy and remaining  aspects of the annulus and osteophytes were removed using high speed drill  down to level of posterior longitudinal ligament.  The posterior  longitudinal ligament was then elevated and resected in piecemeal fashion  using Kerrison rongeurs.  Underlying thecal sac was then identified.  A wide  central decompression was  then performed undercutting the bodies of C6 and  C7 using Kerrison rongeurs.  Decompression then proceeded out each neural  foramen.  Wide anterior foraminotomies then performed along the course of  the exiting C7 nerve roots bilaterally.  A large amount of disk herniation  was encountered on the left side compressing the left-sided C7 nerve root  which was completed resected.  The wound was then irrigated with antibiotic  solution.  Gelfoam was then placed topically for hemostasis, found to be  good.  A 6 mm LifeNet allograft wedge was then impacted into place and  recessed approximately 1 mm from the anterior cortical margin.  A 25 mm  Atlantis anterior cervical plate was then placed over the C6 and C7 levels.  This was then attached under fluoroscopic guidance using 13 mm variable  angle screws, two each both levels.  All four screws given final tightening,  found to be solid within bone.  Locking screws engaged at both levels.  Final images revealed good position of bone grafts, hardware, proper  operative level, normal alignment of spine.  Wound was then irrigated with  antibiotic solution.  Gelfoam was placed topically for hemostasis.  Wound  was then closed in typical fashion.  Steri-Strips and sterile dressing were  applied.  There were no complications.  The patient tolerated the procedure  well and he returned to recovery room postoperatively.           ______________________________  Kathaleen Maser Pool, M.D.     HAP/MEDQ  D:  03/08/2006  T:  03/08/2006  Job:  [id number]

## 2011-01-26 ENCOUNTER — Other Ambulatory Visit: Payer: Self-pay | Admitting: Internal Medicine

## 2011-01-26 ENCOUNTER — Other Ambulatory Visit: Payer: Self-pay

## 2011-01-26 DIAGNOSIS — Z0389 Encounter for observation for other suspected diseases and conditions ruled out: Secondary | ICD-10-CM

## 2011-01-26 DIAGNOSIS — Z0000 Encounter for general adult medical examination without abnormal findings: Secondary | ICD-10-CM

## 2011-01-30 ENCOUNTER — Encounter: Payer: Self-pay | Admitting: Internal Medicine

## 2011-02-06 ENCOUNTER — Encounter: Payer: Self-pay | Admitting: Internal Medicine

## 2011-02-06 ENCOUNTER — Other Ambulatory Visit (INDEPENDENT_AMBULATORY_CARE_PROVIDER_SITE_OTHER): Payer: Managed Care, Other (non HMO)

## 2011-02-06 ENCOUNTER — Ambulatory Visit (INDEPENDENT_AMBULATORY_CARE_PROVIDER_SITE_OTHER): Payer: Managed Care, Other (non HMO) | Admitting: Internal Medicine

## 2011-02-06 VITALS — BP 122/82 | HR 87 | Temp 98.5°F | Resp 14 | Ht 69.0 in | Wt 249.1 lb

## 2011-02-06 DIAGNOSIS — Z136 Encounter for screening for cardiovascular disorders: Secondary | ICD-10-CM

## 2011-02-06 DIAGNOSIS — E669 Obesity, unspecified: Secondary | ICD-10-CM

## 2011-02-06 DIAGNOSIS — Z0000 Encounter for general adult medical examination without abnormal findings: Secondary | ICD-10-CM

## 2011-02-06 DIAGNOSIS — Z2911 Encounter for prophylactic immunotherapy for respiratory syncytial virus (RSV): Secondary | ICD-10-CM

## 2011-02-06 DIAGNOSIS — I10 Essential (primary) hypertension: Secondary | ICD-10-CM

## 2011-02-06 DIAGNOSIS — Z23 Encounter for immunization: Secondary | ICD-10-CM

## 2011-02-06 DIAGNOSIS — E785 Hyperlipidemia, unspecified: Secondary | ICD-10-CM

## 2011-02-06 LAB — CBC WITH DIFFERENTIAL/PLATELET
Basophils Relative: 0.3 % (ref 0.0–3.0)
Eosinophils Relative: 4.8 % (ref 0.0–5.0)
HCT: 45 % (ref 39.0–52.0)
Hemoglobin: 15.4 g/dL (ref 13.0–17.0)
Lymphs Abs: 1.8 10*3/uL (ref 0.7–4.0)
MCV: 86.9 fl (ref 78.0–100.0)
Monocytes Absolute: 0.8 10*3/uL (ref 0.1–1.0)
Monocytes Relative: 11.5 % (ref 3.0–12.0)
Platelets: 254 10*3/uL (ref 150.0–400.0)
RBC: 5.18 Mil/uL (ref 4.22–5.81)
WBC: 6.8 10*3/uL (ref 4.5–10.5)

## 2011-02-06 LAB — URINALYSIS
Ketones, ur: NEGATIVE
Leukocytes, UA: NEGATIVE
Specific Gravity, Urine: 1.02 (ref 1.000–1.030)
Urine Glucose: NEGATIVE
Urobilinogen, UA: 0.2 (ref 0.0–1.0)
pH: 7 (ref 5.0–8.0)

## 2011-02-06 LAB — HEPATIC FUNCTION PANEL
ALT: 23 U/L (ref 0–53)
AST: 24 U/L (ref 0–37)
Alkaline Phosphatase: 83 U/L (ref 39–117)
Total Bilirubin: 1.2 mg/dL (ref 0.3–1.2)

## 2011-02-06 LAB — LIPID PANEL
Cholesterol: 186 mg/dL (ref 0–200)
LDL Cholesterol: 105 mg/dL — ABNORMAL HIGH (ref 0–99)
Total CHOL/HDL Ratio: 4
VLDL: 36.8 mg/dL (ref 0.0–40.0)

## 2011-02-06 LAB — BASIC METABOLIC PANEL
Chloride: 104 mEq/L (ref 96–112)
GFR: 104.58 mL/min (ref >60.00)
Potassium: 4.5 mEq/L (ref 3.5–5.1)
Sodium: 139 mEq/L (ref 135–145)

## 2011-02-06 LAB — TSH: TSH: 1.1 u[IU]/mL (ref 0.35–5.50)

## 2011-02-06 NOTE — Patient Instructions (Signed)
It was good to see you today. Exam and EKG look good - Tdap and Shingles shots done today Medications reviewed, no changes at this time. Test(s) ordered today. Your results will be called to you after review (48-72hours after test completion). If any changes need to be made, you will be notified at that time. Please schedule followup in 1 year for annual exam and labs, call sooner if problems.

## 2011-02-06 NOTE — Assessment & Plan Note (Signed)
BP Readings from Last 3 Encounters:  02/06/11 122/82  06/26/10 126/84  01/31/09 122/82   Well controlled with diet and exercise - advised to watch weight and avoid gain

## 2011-02-06 NOTE — Assessment & Plan Note (Signed)
On statin - tolerating wel Lipids/LFTs today with CPX labs The current medical regimen is effective;  continue present plan and medications.   

## 2011-02-06 NOTE — Progress Notes (Signed)
Subjective:    Patient ID: Jeffrey Romero, male    DOB: [birth year], 60 y.o.   MRN: [id number]  HPI patient is here today for annual physical. Patient feels well and has no complaints.  Also reviewed chronic medical issues: Arthritis -DJD/DDD - S/p B TKR by dr. Charlann Boxer 08/2010 No other hip, back or hand problems - no maintenence meds  HTN - reports compliance with ongoing medical treatment and no changes in medication dose or frequency. denies adverse side effects related to current therapy.   dyslipidemia - on simva for years - reports compliance with ongoing medical treatment and no changes in medication dose or frequency. denies adverse side effects related to current therapy.   Past Medical History  Diagnosis Date  . EXTERNAL HEMORRHOIDS WITHOUT MENTION COMP   . HYPERTENSION   . HYPERLIPIDEMIA   . Osteoarthritis of knee    Family History  Problem Relation Age of Onset  . Adopted: Yes   History  Substance Use Topics  . Smoking status: Never Smoker   . Smokeless tobacco: Not on file   Comment: Married- retired  . Alcohol Use: No    Review of Systems Constitutional: Negative for fever.  Respiratory: Negative for cough and shortness of breath.   Cardiovascular: Negative for chest pain.  Gastrointestinal: Negative for abdominal pain.  Musculoskeletal: Negative for gait problem.  Skin: Negative for rash.  Neurological: Negative for dizziness.  No other specific complaints in a complete review of systems (except as listed in HPI above).     Objective:   Physical Exam BP 122/82  Pulse 87  Temp(Src) 98.5 F (36.9 C) (Oral)  Resp 14  Ht 5\' 9"  (1.753 m)  Wt 249 lb 2 oz (113.002 kg)  BMI 36.79 kg/m2  SpO2 98% Wt Readings from Last 3 Encounters:  02/06/11 249 lb 2 oz (113.002 kg)  06/26/10 245 lb 12.8 oz (111.494 kg)  01/31/09 244 lb (110.678 kg)    Physical Exam  Constitutional:  oriented to person, place, and time. appears well-developed and well-nourished. No  distress.  Neck: Normal range of motion. Neck supple. No JVD present. No thyromegaly present. No carotid bruits Cardiovascular: Normal rate, regular rhythm and normal heart sounds.  No murmur heard. Pulmonary/Chest: Effort normal and breath sounds normal. No respiratory distress. no wheezes.  Abdominal: Soft. Bowel sounds are normal. Patient exhibits no distension. There is no tenderness.  Musculoskeletal: Normal range of motion throughout. B knee TKR scar well healed, no effusions or abnormal warmth - FROM - no other OA changes GU/rectal: deferred Neurological: he is alert and oriented to person, place, and time. No cranial nerve deficit. Coordination normal.  Skin: Skin is warm and dry.  No erythema or ulceration.  Psychiatric: he has a normal mood and affect. behavior is normal. Judgment and thought content normal.   Lab Results  Component Value Date   WBC 10.1 09/01/2010   HGB 10.5* 09/01/2010   HCT 30.9* 09/01/2010   PLT 192 09/01/2010   CHOL 208* 01/31/2009   TRIG 146.0 01/31/2009   HDL 41.20 01/31/2009   LDLDIRECT 145.6 01/31/2009   ALT 28 01/31/2009   AST 27 01/31/2009   NA 142 09/01/2010   K 4.1 09/01/2010   CL 105 09/01/2010   CREATININE 0.86 09/01/2010   BUN 9 09/01/2010   CO2 30 09/01/2010   TSH 0.95 01/31/2009   PSA 1.85 01/31/2009   INR 1.01 08/22/2010   EKG today - NSR 74bpm - no ischemic changes or arrythmia  Assessment & Plan:  CPX - v70.0 - Patient has been counseled on age-appropriate routine health concerns for screening and prevention. These are reviewed and up-to-date. Immunizations are up-to-date or declined. Labs ordered and ECG reviewed.  Obesity - advised to work on diet/exercise to get BMI <30 - counseling and education provided, especially as related to lipids and BP  Also See problem list. Medications and labs reviewed today.

## 2011-08-13 ENCOUNTER — Other Ambulatory Visit: Payer: Self-pay | Admitting: Internal Medicine

## 2012-08-01 ENCOUNTER — Other Ambulatory Visit: Payer: Self-pay | Admitting: Internal Medicine

## 2012-10-09 ENCOUNTER — Other Ambulatory Visit: Payer: Self-pay | Admitting: Internal Medicine

## 2012-11-05 ENCOUNTER — Other Ambulatory Visit (INDEPENDENT_AMBULATORY_CARE_PROVIDER_SITE_OTHER): Payer: Managed Care, Other (non HMO)

## 2012-11-05 ENCOUNTER — Ambulatory Visit (INDEPENDENT_AMBULATORY_CARE_PROVIDER_SITE_OTHER): Payer: Managed Care, Other (non HMO) | Admitting: Internal Medicine

## 2012-11-05 ENCOUNTER — Encounter: Payer: Self-pay | Admitting: Internal Medicine

## 2012-11-05 VITALS — BP 120/92 | HR 76 | Temp 98.1°F | Ht 69.0 in | Wt 253.4 lb

## 2012-11-05 DIAGNOSIS — Z0000 Encounter for general adult medical examination without abnormal findings: Secondary | ICD-10-CM

## 2012-11-05 DIAGNOSIS — I10 Essential (primary) hypertension: Secondary | ICD-10-CM

## 2012-11-05 DIAGNOSIS — E669 Obesity, unspecified: Secondary | ICD-10-CM

## 2012-11-05 DIAGNOSIS — E785 Hyperlipidemia, unspecified: Secondary | ICD-10-CM

## 2012-11-05 LAB — LIPID PANEL
HDL: 41.3 mg/dL (ref >39.00)
Total CHOL/HDL Ratio: 6
Triglycerides: 258 mg/dL — ABNORMAL HIGH (ref 0.0–149.0)

## 2012-11-05 LAB — CBC WITH DIFFERENTIAL/PLATELET
Basophils Absolute: 0 10*3/uL (ref 0.0–0.1)
Basophils Relative: 0.2 % (ref 0.0–3.0)
HCT: 45.6 % (ref 39.0–52.0)
Hemoglobin: 15.5 g/dL (ref 13.0–17.0)
Lymphs Abs: 1.9 10*3/uL (ref 0.7–4.0)
Monocytes Relative: 10.6 % (ref 3.0–12.0)
Neutro Abs: 3.8 10*3/uL (ref 1.4–7.7)
RBC: 5.07 Mil/uL (ref 4.22–5.81)
RDW: 13.4 % (ref 11.5–14.6)

## 2012-11-05 LAB — BASIC METABOLIC PANEL
CO2: 29 mEq/L (ref 19–32)
GFR: 119.33 mL/min (ref >60.00)
Glucose, Bld: 85 mg/dL (ref 70–99)
Potassium: 4.7 mEq/L (ref 3.5–5.1)
Sodium: 140 mEq/L (ref 135–145)

## 2012-11-05 LAB — PSA: PSA: 3.45 ng/mL (ref 0.10–4.00)

## 2012-11-05 LAB — URINALYSIS, ROUTINE W REFLEX MICROSCOPIC
Bilirubin Urine: NEGATIVE
Hgb urine dipstick: NEGATIVE
Nitrite: NEGATIVE
Urobilinogen, UA: 0.2 (ref 0.0–1.0)

## 2012-11-05 LAB — HEPATIC FUNCTION PANEL
ALT: 25 U/L (ref 0–53)
Total Bilirubin: 0.8 mg/dL (ref 0.3–1.2)

## 2012-11-05 LAB — LDL CHOLESTEROL, DIRECT: Direct LDL: 148.2 mg/dL

## 2012-11-05 MED ORDER — SIMVASTATIN 20 MG PO TABS: 20.0000 mg | ORAL_TABLET | Freq: Every day | ORAL | Status: DC

## 2012-11-05 NOTE — Assessment & Plan Note (Signed)
Wt Readings from Last 3 Encounters:  11/05/12 253 lb 6.4 oz (114.941 kg)  02/06/11 249 lb 2 oz (113.002 kg)  06/26/10 245 lb 12.8 oz (111.494 kg)   The patient is asked to make an attempt to improve diet and exercise patterns to aid in medical management of this problem.  advised to work on diet/exercise to get BMI <30 - counseling and education provided, especially as related to lipids and BP

## 2012-11-05 NOTE — Patient Instructions (Signed)
It was good to see you today. We have reviewed your prior records including labs and tests today Test(s) ordered today. Your results will be released to MyChart (or called to you) after review, usually within 72hours after test completion. If any changes need to be made, you will be notified at that same time. Medications reviewed and updated, no changes at this time. Refill on medication(s) as discussed today. Work on lifestyle changes as discussed (low fat, low carb, increased protein diet; improved exercise efforts; weight loss) to control sugar, blood pressure and cholesterol levels and/or reduce risk of developing other medical problems. Look into [email or web address] or other type of food journal to assist you in this process. Please schedule followup in 12 months for annual physical and labs, call sooner if problems.  Health Maintenance, Males A healthy lifestyle and preventative care can promote health and wellness.  Maintain regular health, dental, and eye exams.  Eat a healthy diet. Foods like vegetables, fruits, whole grains, low-fat dairy products, and lean protein foods contain the nutrients you need without too many calories. Decrease your intake of foods high in solid fats, added sugars, and salt. Get information about a proper diet from your caregiver, if necessary.  Regular physical exercise is one of the most important things you can do for your health. Most adults should get at least 150 minutes of moderate-intensity exercise (any activity that increases your heart rate and causes you to sweat) each week. In addition, most adults need muscle-strengthening exercises on 2 or more days a week.   Maintain a healthy weight. The body mass index (BMI) is a screening tool to identify possible weight problems. It provides an estimate of body fat based on height and weight. Your caregiver can help determine your BMI, and can help you achieve or maintain a healthy weight. For adults 20 years and  older:  A BMI below 18.5 is considered underweight.  A BMI of 18.5 to 24.9 is normal.  A BMI of 25 to 29.9 is considered overweight.  A BMI of 30 and above is considered obese.  Maintain normal blood lipids and cholesterol by exercising and minimizing your intake of saturated fat. Eat a balanced diet with plenty of fruits and vegetables. Blood tests for lipids and cholesterol should begin at age 51 and be repeated every 5 years. If your lipid or cholesterol levels are high, you are over 50, or you are a high risk for heart disease, you may need your cholesterol levels checked more frequently.Ongoing high lipid and cholesterol levels should be treated with medicines, if diet and exercise are not effective.  If you smoke, find out from your caregiver how to quit. If you do not use tobacco, do not start.  If you choose to drink alcohol, do not exceed 2 drinks per day. One drink is considered to be 12 ounces (355 mL) of beer, 5 ounces (148 mL) of wine, or 1.5 ounces (44 mL) of liquor.  Avoid use of street drugs. Do not share needles with anyone. Ask for help if you need support or instructions about stopping the use of drugs.  High blood pressure causes heart disease and increases the risk of stroke. Blood pressure should be checked at least every 1 to 2 years. Ongoing high blood pressure should be treated with medicines if weight loss and exercise are not effective.  If you are 31 to 63 years old, ask your caregiver if you should take aspirin to prevent heart disease.  Diabetes screening  involves taking a blood sample to check your fasting blood sugar level. This should be done once every 3 years, after age 15, if you are within normal weight and without risk factors for diabetes. Testing should be considered at a younger age or be carried out more frequently if you are overweight and have at least 1 risk factor for diabetes.  Colorectal cancer can be detected and often prevented. Most routine  colorectal cancer screening begins at the age of 72 and continues through age 78. However, your caregiver may recommend screening at an earlier age if you have risk factors for colon cancer. On a yearly basis, your caregiver may provide home test kits to check for hidden blood in the stool. Use of a small camera at the end of a tube, to directly examine the colon (sigmoidoscopy or colonoscopy), can detect the earliest forms of colorectal cancer. Talk to your caregiver about this at age 30, when routine screening begins. Direct examination of the colon should be repeated every 5 to 10 years through age 44, unless early forms of pre-cancerous polyps or small growths are found.  Hepatitis C blood testing is recommended for all people born from [birth year] through 1966 and any individual with known risks for hepatitis C.  Healthy men should no longer receive prostate-specific antigen (PSA) blood tests as part of routine cancer screening. Consult with your caregiver about prostate cancer screening.  Testicular cancer screening is not recommended for adolescents or adult males who have no symptoms. Screening includes self-exam, caregiver exam, and other screening tests. Consult with your caregiver about any symptoms you have or any concerns you have about testicular cancer.  Practice safe sex. Use condoms and avoid high-risk sexual practices to reduce the spread of sexually transmitted infections (STIs).  Use sunscreen with a sun protection factor (SPF) of 30 or greater. Apply sunscreen liberally and repeatedly throughout the day. You should seek shade when your shadow is shorter than you. Protect yourself by wearing long sleeves, pants, a wide-brimmed hat, and sunglasses year round, whenever you are outdoors.  Notify your caregiver of new moles or changes in moles, especially if there is a change in shape or color. Also notify your caregiver if a mole is larger than the size of a pencil eraser.  A one-time  screening for abdominal aortic aneurysm (AAA) and surgical repair of large AAAs by sound wave imaging (ultrasonography) is recommended for ages 7 to 81 years who are current or former smokers.  Stay current with your immunizations. Document Released: 01/05/2008 Document Revised: 10/01/2011 Document Reviewed: 12/04/2010 [facility] Patient Information 2013 [place], [place]. Exercise to Lose Weight Exercise and a healthy diet may help you lose weight. Your doctor may suggest specific exercises. EXERCISE IDEAS AND TIPS  Choose low-cost things you enjoy doing, such as walking, bicycling, or exercising to workout videos.  Take stairs instead of the elevator.  Walk during your lunch break.  Park your car further away from work or school.  Go to a gym or an exercise class.  Start with 5 to 10 minutes of exercise each day. Build up to 30 minutes of exercise 4 to 6 days a week.  Wear shoes with good support and comfortable clothes.  Stretch before and after working out.  Work out until you breathe harder and your heart beats faster.  Drink extra water when you exercise.  Do not do so much that you hurt yourself, feel dizzy, or get very short of breath. Exercises that burn about  150 calories:  Running 1  miles in 15 minutes.  Playing volleyball for 45 to 60 minutes.  Washing and waxing a car for 45 to 60 minutes.  Playing touch football for 45 minutes.  Walking 1  miles in 35 minutes.  Pushing a stroller 1  miles in 30 minutes.  Playing basketball for 30 minutes.  Raking leaves for 30 minutes.  Bicycling 5 miles in 30 minutes.  Walking 2 miles in 30 minutes.  Dancing for 30 minutes.  Shoveling snow for 15 minutes.  Swimming laps for 20 minutes.  Walking up stairs for 15 minutes.  Bicycling 4 miles in 15 minutes.  Gardening for 30 to 45 minutes.  Jumping rope for 15 minutes.  Washing windows or floors for 45 to 60 minutes. Document Released: 08/11/2010 Document  Revised: 10/01/2011 Document Reviewed: 08/11/2010 [facility] Patient Information 2013 [place], [place].

## 2012-11-05 NOTE — Progress Notes (Signed)
Subjective:    Patient ID: Jeffrey Romero, male    DOB: [birth year], 62 y.o.   MRN: [id number]  HPI  patient is here today for annual physical. Patient feels well overall.  Also reviewed chronic medical issues: Arthritis -DJD/DDD - S/p B TKR by dr. Charlann Boxer 08/2010 No other hip, back or hand problems - no maintenence meds  HTN - reports compliance with ongoing medical treatment and no changes in medication dose or frequency. denies adverse side effects related to current therapy.   dyslipidemia - on simva for years, out of refills x 1 week - otherwise, reports compliance with ongoing medical treatment and no changes in medication dose or frequency. denies adverse side effects related to current therapy.   Past Medical History  Diagnosis Date  . EXTERNAL HEMORRHOIDS WITHOUT MENTION COMP   . HYPERTENSION   . HYPERLIPIDEMIA   . Osteoarthritis of knee    Family History  Problem Relation Age of Onset  . Adopted: Yes  . Family history unknown: Yes   History  Substance Use Topics  . Smoking status: Never Smoker   . 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 Alcohol Use: No    Review of Systems  Constitutional: Negative for fever.  Respiratory: Negative for cough and shortness of breath.   Cardiovascular: Negative for chest pain.  Gastrointestinal: Negative for abdominal pain.  Musculoskeletal: Negative for gait problem.  Skin: Negative for rash.  Neurological: Negative for dizziness.  No other specific complaints in a complete review of systems (except as listed in HPI above).     Objective:   Physical Exam  BP 120/92  Pulse 76  Temp(Src) 98.1 F (36.7 C) (Oral)  Ht 5\' 9"  (1.753 m)  Wt 253 lb 6.4 oz (114.941 kg)  BMI 37.4 kg/m2  SpO2 98% Wt Readings from Last 3 Encounters:  11/05/12 253 lb 6.4 oz (114.941 kg)  02/06/11 249 lb 2 oz (113.002 kg)  06/26/10 245 lb 12.8 oz (111.494 kg)    Constitutional: he appears well-developed and well-nourished. No distress.  HENT: Head: Normocephalic and atraumatic. Ears: B TMs ok, no erythema or effusion; Nose: Nose normal. Mouth/Throat: Oropharynx is clear and moist. No oropharyngeal exudate.  Eyes: Conjunctivae and EOM are normal. Pupils are equal, round, and reactive to light. No scleral icterus.  Neck: Normal range of motion. Neck supple. No JVD present. No thyromegaly present.  Cardiovascular: Normal rate, regular rhythm and normal heart sounds.  No murmur heard. No BLE edema. Pulmonary/Chest: Effort normal and breath sounds normal. No respiratory distress. he has no wheezes.  Abdominal: Soft. Bowel sounds are normal. he exhibits no distension. There is no tenderness. no masses Musculoskeletal: Normal range of motion, no joint effusions. No gross deformities Neurological: he is alert and oriented to person, place, and time. No cranial nerve deficit. Coordination normal.  Skin: Skin is warm and dry. No rash noted. No erythema.  Psychiatric: he has a normal mood and affect. behavior is normal. Judgment and thought content normal.   Lab Results  Component Value Date   WBC 6.8 02/06/2011   HGB 15.4 02/06/2011   HCT 45.0 02/06/2011   PLT 254.0 02/06/2011   CHOL 186 02/06/2011   TRIG 184.0* 02/06/2011   HDL 44.70 02/06/2011   LDLDIRECT 145.6 01/31/2009   ALT 23 02/06/2011   AST 24 02/06/2011   NA 139 02/06/2011   K 4.5 02/06/2011   CL  104 02/06/2011   CREATININE 0.8 02/06/2011   BUN 16 02/06/2011   CO2 30 02/06/2011   TSH 1.10 02/06/2011   PSA 2.72 02/06/2011   INR 1.01 08/22/2010   EKG today - NSR 80bpm, occ PACs - no ischemic changes or arrythmia -no change from 01/2011       Assessment & Plan:  CPX - v70.0 - Patient has been counseled on age-appropriate routine health concerns for screening and prevention. These are reviewed and up-to-date. Immunizations are up-to-date or declined. Labs ordered and ECG reviewed.  Also See problem  list. Medications and labs reviewed today.

## 2012-11-05 NOTE — Assessment & Plan Note (Signed)
BP Readings from Last 3 Encounters:  11/05/12 120/92  02/06/11 122/82  06/26/10 126/84   Well controlled with diet and exercise - advised to watch weight and avoid gain

## 2012-11-05 NOTE — Assessment & Plan Note (Signed)
On statin - tolerating wel Lipids/LFTs today with CPX labs The current medical regimen is effective;  continue present plan and medications.

## 2013-05-28 ENCOUNTER — Other Ambulatory Visit: Payer: Self-pay

## 2013-12-16 ENCOUNTER — Other Ambulatory Visit: Payer: Self-pay | Admitting: Internal Medicine

## 2014-03-18 ENCOUNTER — Other Ambulatory Visit: Payer: Self-pay | Admitting: Internal Medicine

## 2014-03-22 ENCOUNTER — Other Ambulatory Visit: Payer: Self-pay | Admitting: Internal Medicine

## 2014-03-22 NOTE — Telephone Encounter (Signed)
Needs an appointment for any additional refills.

## 2014-05-05 ENCOUNTER — Ambulatory Visit: Payer: Self-pay | Admitting: Internal Medicine

## 2014-05-07 ENCOUNTER — Other Ambulatory Visit: Payer: Self-pay

## 2014-05-17 ENCOUNTER — Ambulatory Visit: Payer: Managed Care, Other (non HMO) | Admitting: Internal Medicine

## 2014-06-23 ENCOUNTER — Other Ambulatory Visit: Payer: Self-pay | Admitting: Internal Medicine

## 2014-09-20 ENCOUNTER — Other Ambulatory Visit: Payer: Self-pay | Admitting: Internal Medicine

## 2014-11-16 ENCOUNTER — Encounter: Payer: Self-pay | Admitting: Nurse Practitioner

## 2014-11-16 ENCOUNTER — Ambulatory Visit (INDEPENDENT_AMBULATORY_CARE_PROVIDER_SITE_OTHER): Payer: Managed Care, Other (non HMO) | Admitting: Nurse Practitioner

## 2014-11-16 VITALS — BP 130/88 | HR 78 | Temp 98.0°F | Resp 14 | Ht 69.0 in | Wt 248.0 lb

## 2014-11-16 DIAGNOSIS — Z125 Encounter for screening for malignant neoplasm of prostate: Secondary | ICD-10-CM | POA: Diagnosis not present

## 2014-11-16 DIAGNOSIS — Z130 Encounter for screening for diseases of the blood and blood-forming organs and certain disorders involving the immune mechanism: Secondary | ICD-10-CM | POA: Diagnosis not present

## 2014-11-16 DIAGNOSIS — I10 Essential (primary) hypertension: Secondary | ICD-10-CM | POA: Diagnosis not present

## 2014-11-16 DIAGNOSIS — D229 Melanocytic nevi, unspecified: Secondary | ICD-10-CM | POA: Diagnosis not present

## 2014-11-16 LAB — CBC WITH DIFFERENTIAL/PLATELET
BASOS PCT: 0.3 % (ref 0.0–3.0)
Basophils Absolute: 0 10*3/uL (ref 0.0–0.1)
EOS ABS: 0.3 10*3/uL (ref 0.0–0.7)
Eosinophils Relative: 5 % (ref 0.0–5.0)
HCT: 44.9 % (ref 39.0–52.0)
Hemoglobin: 15.2 g/dL (ref 13.0–17.0)
LYMPHS PCT: 32.6 % (ref 12.0–46.0)
Lymphs Abs: 1.9 10*3/uL (ref 0.7–4.0)
MCHC: 33.8 g/dL (ref 30.0–36.0)
MCV: 91.2 fl (ref 78.0–100.0)
MONOS PCT: 12.4 % — AB (ref 3.0–12.0)
Monocytes Absolute: 0.7 10*3/uL (ref 0.1–1.0)
NEUTROS ABS: 2.9 10*3/uL (ref 1.4–7.7)
Neutrophils Relative %: 49.7 % (ref 43.0–77.0)
Platelets: 227 10*3/uL (ref 150.0–400.0)
RBC: 4.92 Mil/uL (ref 4.22–5.81)
RDW: 13.6 % (ref 11.5–15.5)
WBC: 5.9 10*3/uL (ref 4.0–10.5)

## 2014-11-16 LAB — PSA: PSA: 3.73 ng/mL (ref 0.10–4.00)

## 2014-11-16 NOTE — Progress Notes (Signed)
Subjective:    Patient ID: Jeffrey Romero, male    DOB: 28-Apr-1951, 64 y.o.   MRN: [id number]  HPI  [name] is a 64 yo male establishing care today and a CC of nevus on right shoulder.   1) Patient would like to check his PSA and CBC w/ diff, he reports he has had a PSA in 2014 and would like to keep an eye on it. He had no labs last year. Saw Dr. Asa Lente in 2014 and is transitioning to [place] since he is closer and she is moving into more administrative roles.   2) Nevus on right shoulder, itchy at times, changed in last 6 months  No sunscreen or protective clothing, no changes to color or bleeding  Review of Systems  Constitutional: Negative for fever, chills, diaphoresis and fatigue.  HENT: Negative for tinnitus and trouble swallowing.   Eyes: Negative for visual disturbance.  Respiratory: Negative for chest tightness, shortness of breath and wheezing.   Cardiovascular: Negative for chest pain, palpitations and leg swelling.  Gastrointestinal: Negative for nausea, vomiting, diarrhea and constipation.  Genitourinary: Negative for difficulty urinating.  Skin: Positive for color change.       Nevus right shoulder  Neurological: Negative for dizziness, weakness, numbness and headaches.  Hematological: Does not bruise/bleed easily.  Psychiatric/Behavioral: Negative for suicidal ideas and sleep disturbance. The patient is not nervous/anxious.    Past Medical History  Diagnosis Date  . EXTERNAL HEMORRHOIDS WITHOUT MENTION COMP   . HYPERTENSION   . HYPERLIPIDEMIA   . Osteoarthritis of knee     History   Social History  . Marital Status: Single    Spouse Name: N/A  . Number of Children: N/A  . Years of Education: N/A   Occupational History  . Not on file.   Social History Main Topics  . Smoking status: Never Smoker   . 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 Alcohol Use: No  . Drug Use: No  .  Sexual Activity: Not on file   Other Topics Concern  . Not on file   Social History Narrative    Past Surgical History  Procedure Laterality Date  . Tonsillectomy and adenoidectomy    . Neck fusion  05 & 07  . Right knee surgery  2006  . 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 Adopted: Yes  . Family history unknown: Yes    No Known Allergies  Current Outpatient Prescriptions on File Prior to Visit  Medication Sig Dispense Refill  . ibuprofen (ADVIL,MOTRIN) 200 MG tablet Take 200 mg by mouth as needed.     . Multiple Vitamins-Minerals (CENTRUM SILVER ADULT 50+) TABS Take by mouth daily.    . simvastatin (ZOCOR) 20 MG tablet Take 1 tablet (20 mg total) by mouth at bedtime. 90 tablet 1   No current facility-administered medications on file prior to visit.      Objective:   Physical Exam  Constitutional: He is oriented to person, place, and time. He appears well-developed and well-nourished. No distress.  BP 130/88 mmHg  Pulse 78  Temp(Src) 98 F (36.7 C) (Oral)  Resp 14  Ht 5\' 9"  (1.753 m)  Wt 248 lb (112.492 kg)  BMI 36.61 kg/m2  SpO2 97%   HENT:  Head: Normocephalic and atraumatic.  Right  Ear: External ear normal.  Left Ear: External ear normal.  Eyes: EOM are normal. Pupils are equal, round, and reactive to light. Right eye exhibits no discharge. Left eye exhibits no discharge. No scleral icterus.  Neck: Normal range of motion. Neck supple. No thyromegaly present.  Cardiovascular: Normal rate, regular rhythm, normal heart sounds and intact distal pulses.  Exam reveals no gallop and no friction rub.   No murmur heard. Pulmonary/Chest: Effort normal and breath sounds normal. No respiratory distress. He has no wheezes. He has no rales. He exhibits no tenderness.  Lymphadenopathy:    He has no cervical adenopathy.  Neurological: He is alert and oriented to person, place, and time.  Skin: Skin is  warm and dry. No rash noted. He is not diaphoretic.  6 mm right superior shoulder, uniform flesh coloring, circular, scaly on top  Psychiatric: He has a normal mood and affect. His behavior is normal. Judgment and thought content normal.      Assessment & Plan:

## 2014-11-16 NOTE — Progress Notes (Signed)
Pre visit review using our clinic review tool, if applicable. No additional management support is needed unless otherwise documented below in the visit note. 

## 2014-11-16 NOTE — Patient Instructions (Signed)
Make an appointment for an annual physical (preferably morning) nothing to eat or drink after midnight- black coffee with no cream/sugar and water is okay.   We will contact you with your results via MyChart.   We will contact you with your referral to dermatology.

## 2014-12-02 DIAGNOSIS — Z130 Encounter for screening for diseases of the blood and blood-forming organs and certain disorders involving the immune mechanism: Secondary | ICD-10-CM | POA: Insufficient documentation

## 2014-12-02 NOTE — Assessment & Plan Note (Signed)
BP Readings from Last 3 Encounters:  11/16/14 130/88  11/05/12 120/92  02/06/11 122/82   Controlled

## 2014-12-02 NOTE — Assessment & Plan Note (Signed)
Referred to dermatology for biopsy and or removal.

## 2014-12-16 ENCOUNTER — Telehealth: Payer: Self-pay | Admitting: *Deleted

## 2014-12-17 ENCOUNTER — Encounter: Payer: Self-pay | Admitting: Nurse Practitioner

## 2014-12-17 ENCOUNTER — Other Ambulatory Visit: Payer: Managed Care, Other (non HMO)

## 2014-12-17 ENCOUNTER — Ambulatory Visit (INDEPENDENT_AMBULATORY_CARE_PROVIDER_SITE_OTHER): Payer: Managed Care, Other (non HMO) | Admitting: Nurse Practitioner

## 2014-12-17 VITALS — BP 122/82 | HR 72 | Temp 97.6°F | Resp 14 | Ht 69.0 in | Wt 246.4 lb

## 2014-12-17 DIAGNOSIS — E669 Obesity, unspecified: Secondary | ICD-10-CM

## 2014-12-17 DIAGNOSIS — E785 Hyperlipidemia, unspecified: Secondary | ICD-10-CM

## 2014-12-17 DIAGNOSIS — K648 Other hemorrhoids: Secondary | ICD-10-CM

## 2014-12-17 DIAGNOSIS — D229 Melanocytic nevi, unspecified: Secondary | ICD-10-CM | POA: Diagnosis not present

## 2014-12-17 DIAGNOSIS — K644 Residual hemorrhoidal skin tags: Secondary | ICD-10-CM

## 2014-12-17 DIAGNOSIS — I10 Essential (primary) hypertension: Secondary | ICD-10-CM

## 2014-12-17 DIAGNOSIS — Z0000 Encounter for general adult medical examination without abnormal findings: Secondary | ICD-10-CM

## 2014-12-17 LAB — COMPREHENSIVE METABOLIC PANEL
ALK PHOS: 77 U/L (ref 39–117)
ALT: 36 U/L (ref 0–53)
AST: 32 U/L (ref 0–37)
Albumin: 4.7 g/dL (ref 3.5–5.2)
BILIRUBIN TOTAL: 0.8 mg/dL (ref 0.2–1.2)
BUN: 17 mg/dL (ref 6–23)
CALCIUM: 9.6 mg/dL (ref 8.4–10.5)
CO2: 30 mEq/L (ref 19–32)
Chloride: 102 mEq/L (ref 96–112)
Creatinine, Ser: 0.79 mg/dL (ref 0.40–1.50)
GFR: 104.79 mL/min (ref >60.00)
Glucose, Bld: 102 mg/dL — ABNORMAL HIGH (ref 70–99)
Potassium: 4.7 mEq/L (ref 3.5–5.1)
Sodium: 138 mEq/L (ref 135–145)
Total Protein: 7.2 g/dL (ref 6.0–8.3)

## 2014-12-17 LAB — LIPID PANEL
Cholesterol: 207 mg/dL — ABNORMAL HIGH (ref 0–200)
HDL: 51.2 mg/dL (ref >39.00)
NONHDL: 155.8
Total CHOL/HDL Ratio: 4
Triglycerides: 208 mg/dL — ABNORMAL HIGH (ref 0.0–149.0)
VLDL: 41.6 mg/dL — AB (ref 0.0–40.0)

## 2014-12-17 LAB — LDL CHOLESTEROL, DIRECT: Direct LDL: 119 mg/dL

## 2014-12-17 LAB — HEMOGLOBIN A1C: Hgb A1c MFr Bld: 5.3 % (ref 4.6–6.5)

## 2014-12-17 NOTE — Progress Notes (Signed)
Subjective:    Patient ID: Jeffrey Romero, male    DOB: 23-Mar-1951, 64 y.o.   MRN: [id number]  HPI  Jeffrey Romero is a 64 yo male here for annual physical.   1) Health Maintenance-   Diet- No formal   Exercise- 2-3 x a week walks the dog for 2-3 miles  Immunizations- UTD  Colonoscopy- 2019 due  PSA- April 2016, normal   Eye Exam- 6 months ago  Dental Exam- UTD  2) Chronic Problems-  OA of knees- stable, no concerns, bilateral knee replacement  Hemorrhoids- stable, no concerns    Review of Systems  Constitutional: Negative for fever, chills, diaphoresis and fatigue.  HENT: Negative for tinnitus and trouble swallowing.   Eyes: Negative for visual disturbance.  Respiratory: Negative for chest tightness, shortness of breath and wheezing.   Cardiovascular: Negative for chest pain, palpitations and leg swelling.  Gastrointestinal: Negative for nausea, vomiting and diarrhea.  Genitourinary: Negative for difficulty urinating.  Musculoskeletal: Negative for back pain and neck pain.  Skin: Negative for rash.  Neurological: Negative for dizziness, weakness and numbness.  Psychiatric/Behavioral: The patient is not nervous/anxious.    Past Medical History  Diagnosis Date  . EXTERNAL HEMORRHOIDS WITHOUT MENTION COMP   . HYPERTENSION   . HYPERLIPIDEMIA   . Osteoarthritis of knee     History   Social History  . Marital Status: Single    Spouse Name: N/A  . Number of Children: N/A  . Years of Education: N/A   Occupational History  . Not on file.   Social History Main Topics  . Smoking status: Never Smoker   . 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 Alcohol Use: 0.0 oz/week    0 Standard drinks or equivalent per week     Comment: Occasionally wine or beer   . Drug Use: No  . Sexual Activity: No   Other Topics Concern  . Not on file   Social History Narrative   Retired    Lives by himself    Pets: 1 dog inside    Caffeine- 1 cup coffee    Right handed    Enjoys yard work, Insurance underwriter     Past Surgical History  Procedure Laterality Date  . Tonsillectomy and adenoidectomy    . Neck fusion  05 & 07  . Right knee surgery  2006  . 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 Adopted: Yes  . Family history unknown: Yes    No Known Allergies  Current Outpatient Prescriptions on File Prior to Visit  Medication Sig Dispense Refill  . ibuprofen (ADVIL,MOTRIN) 200 MG tablet Take 200 mg by mouth as needed.     . Multiple Vitamins-Minerals (CENTRUM SILVER ADULT 50+) TABS Take by mouth daily.    . Omega-3 Fatty Acids (FISH OIL) 1200 MG CAPS Take 1,200 mg by mouth.    . saw palmetto 160 MG capsule Take 160 mg by mouth 2 (two) times daily.    . simvastatin (ZOCOR) 20 MG tablet Take 1 tablet (20 mg total) by mouth at bedtime. 90 tablet 1   No current facility-administered medications on file prior to visit.      Objective:   Physical Exam  Constitutional: He is oriented to person, place, and time. He appears well-developed and well-nourished. No distress.  BP 122/82 mmHg  Pulse 72  Temp(Src) 97.6 F (36.4 C) (Oral)  Resp 14  Ht 5\' 9"  (1.753 m)  Wt 246 lb 6.4 oz (111.766 kg)  BMI 36.37 kg/m2  SpO2 96%   HENT:  Head: Normocephalic and atraumatic.  Right Ear: External ear normal.  Left Ear: External ear normal.  Nose: Nose normal.  Mouth/Throat: Oropharynx is clear and moist. No oropharyngeal exudate.  TM's obscured by cerumen  Eyes: Conjunctivae and EOM are normal. Pupils are equal, round, and reactive to light. Right eye exhibits no discharge. Left eye exhibits no discharge. No scleral icterus.  Neck: Normal range of motion. Neck supple. No thyromegaly present.  Cardiovascular: Normal rate, regular rhythm, normal heart sounds and intact distal pulses.  Exam reveals no gallop and no friction rub.   No murmur  heard. Pulmonary/Chest: Effort normal and breath sounds normal. No respiratory distress. He has no wheezes. He has no rales. He exhibits no tenderness.  Abdominal: Soft. Bowel sounds are normal. He exhibits no distension and no mass. There is no tenderness. There is no rebound and no guarding.  Obese  Musculoskeletal: Normal range of motion. He exhibits no edema or tenderness.  Lymphadenopathy:    He has no cervical adenopathy.  Neurological: He is alert and oriented to person, place, and time. He displays normal reflexes. No cranial nerve deficit. He exhibits normal muscle tone. Coordination normal.  Skin: Skin is warm and dry. No rash noted. He is not diaphoretic.  Tanned skin  Well healed scars on knees  Psychiatric: He has a normal mood and affect. His behavior is normal. Judgment and thought content normal.      Assessment & Plan:

## 2014-12-17 NOTE — Patient Instructions (Signed)

## 2014-12-17 NOTE — Assessment & Plan Note (Signed)
Obtain Lipid panel

## 2014-12-17 NOTE — Assessment & Plan Note (Signed)
No concerns today 

## 2014-12-17 NOTE — Assessment & Plan Note (Signed)
Had nevus removed mid May.

## 2014-12-17 NOTE — Progress Notes (Signed)
Pre visit review using our clinic review tool, if applicable. No additional management support is needed unless otherwise documented below in the visit note. 

## 2014-12-17 NOTE — Assessment & Plan Note (Signed)
Discussed acute and chronic issues. Reviewed health maintenance measures, PFSHx, and immunizations. Obtain routine labs Hepatits C screen, Lipid panel,  A1c, and CMET.

## 2014-12-17 NOTE — Assessment & Plan Note (Signed)
Not on medication currently and controlled.   BP Readings from Last 3 Encounters:  12/17/14 122/82  11/16/14 130/88  11/05/12 120/92

## 2014-12-17 NOTE — Assessment & Plan Note (Signed)
Encouraged diet and exercise.  

## 2014-12-17 NOTE — Addendum Note (Signed)
Addended by: Johnsie Cancel on: 12/17/2014 08:57 AM   Modules accepted: Miquel Dunn

## 2014-12-18 LAB — HEPATITIS C ANTIBODY: HCV Ab: NEGATIVE

## 2014-12-22 ENCOUNTER — Telehealth: Payer: Self-pay

## 2014-12-22 ENCOUNTER — Other Ambulatory Visit: Payer: Self-pay

## 2014-12-22 MED ORDER — SIMVASTATIN 20 MG PO TABS: 20.0000 mg | ORAL_TABLET | Freq: Every day | ORAL | Status: DC

## 2014-12-22 NOTE — Telephone Encounter (Signed)
Last OV 12/17/14. Medication pended.

## 2014-12-22 NOTE — Telephone Encounter (Signed)
The pt called and is hoping to have his simvastatin refilled  Pharmacy- [facility] on [address]  Pt callback [phone]

## 2014-12-22 NOTE — Telephone Encounter (Signed)
Noted. Thanks. Will follow up with pt.

## 2015-02-17 ENCOUNTER — Encounter: Payer: Self-pay | Admitting: Internal Medicine

## 2015-04-27 ENCOUNTER — Other Ambulatory Visit: Payer: Self-pay | Admitting: Internal Medicine

## 2015-08-09 DIAGNOSIS — Z0100 Encounter for examination of eyes and vision without abnormal findings: Secondary | ICD-10-CM | POA: Diagnosis not present

## 2015-10-03 ENCOUNTER — Other Ambulatory Visit: Payer: Self-pay | Admitting: Nurse Practitioner

## 2015-12-07 DIAGNOSIS — E7800 Pure hypercholesterolemia, unspecified: Secondary | ICD-10-CM | POA: Insufficient documentation

## 2015-12-07 DIAGNOSIS — M25521 Pain in right elbow: Secondary | ICD-10-CM | POA: Diagnosis not present

## 2015-12-07 DIAGNOSIS — S59909A Unspecified injury of unspecified elbow, initial encounter: Secondary | ICD-10-CM | POA: Insufficient documentation

## 2015-12-07 DIAGNOSIS — S50351A Superficial foreign body of right elbow, initial encounter: Secondary | ICD-10-CM | POA: Diagnosis not present

## 2016-01-02 ENCOUNTER — Other Ambulatory Visit: Payer: Self-pay | Admitting: Nurse Practitioner

## 2016-02-16 ENCOUNTER — Encounter: Payer: Self-pay | Admitting: Family Medicine

## 2016-02-16 ENCOUNTER — Ambulatory Visit (INDEPENDENT_AMBULATORY_CARE_PROVIDER_SITE_OTHER): Payer: Medicare HMO | Admitting: Family Medicine

## 2016-02-16 VITALS — BP 146/110 | HR 83 | Temp 98.0°F | Wt 259.4 lb

## 2016-02-16 DIAGNOSIS — M10071 Idiopathic gout, right ankle and foot: Secondary | ICD-10-CM | POA: Diagnosis not present

## 2016-02-16 MED ORDER — PREDNISONE 10 MG PO TABS: ORAL_TABLET | ORAL | 0 refills | Status: DC

## 2016-02-16 NOTE — Patient Instructions (Signed)
Take the prednisone as prescribed.  Follow up in the next few weeks with me.  Take care  Dr. Lacinda Axon    Gout Gout is an inflammatory arthritis caused by a buildup of uric acid crystals in the joints. Uric acid is a chemical that is normally present in the blood. When the level of uric acid in the blood is too high it can form crystals that deposit in your joints and tissues. This causes joint redness, soreness, and swelling (inflammation). Repeat attacks are common. Over time, uric acid crystals can form into masses (tophi) near a joint, destroying bone and causing disfigurement. Gout is treatable and often preventable. CAUSES  The disease begins with elevated levels of uric acid in the blood. Uric acid is produced by your body when it breaks down a naturally found substance called purines. Certain foods you eat, such as meats and fish, contain high amounts of purines. Causes of an elevated uric acid level include:  Being passed down from parent to child (heredity).  Diseases that cause increased uric acid production (such as obesity, psoriasis, and certain cancers).  Excessive alcohol use.  Diet, especially diets rich in meat and seafood.  Medicines, including certain cancer-fighting medicines (chemotherapy), water pills (diuretics), and aspirin.  Chronic kidney disease. The kidneys are no longer able to remove uric acid well.  Problems with metabolism. Conditions strongly associated with gout include:  Obesity.  High blood pressure.  High cholesterol.  Diabetes. Not everyone with elevated uric acid levels gets gout. It is not understood why some people get gout and others do not. Surgery, joint injury, and eating too much of certain foods are some of the factors that can lead to gout attacks. SYMPTOMS   An attack of gout comes on quickly. It causes intense pain with redness, swelling, and warmth in a joint.  Fever can occur.  Often, only one joint is involved. Certain joints  are more commonly involved:  Base of the big toe.  Knee.  Ankle.  Wrist.  Finger. Without treatment, an attack usually goes away in a few days to weeks. Between attacks, you usually will not have symptoms, which is different from many other forms of arthritis. DIAGNOSIS  Your caregiver will suspect gout based on your symptoms and exam. In some cases, tests may be recommended. The tests may include:  Blood tests.  Urine tests.  X-rays.  Joint fluid exam. This exam requires a needle to remove fluid from the joint (arthrocentesis). Using a microscope, gout is confirmed when uric acid crystals are seen in the joint fluid. TREATMENT  There are two phases to gout treatment: treating the sudden onset (acute) attack and preventing attacks (prophylaxis).  Treatment of an Acute Attack.  Medicines are used. These include anti-inflammatory medicines or steroid medicines.  An injection of steroid medicine into the affected joint is sometimes necessary.  The painful joint is rested. Movement can worsen the arthritis.  You may use warm or cold treatments on painful joints, depending which works best for you.  Treatment to Prevent Attacks.  If you suffer from frequent gout attacks, your caregiver may advise preventive medicine. These medicines are started after the acute attack subsides. These medicines either help your kidneys eliminate uric acid from your body or decrease your uric acid production. You may need to stay on these medicines for a very long time.  The early phase of treatment with preventive medicine can be associated with an increase in acute gout attacks. For this reason, during  the first few months of treatment, your caregiver may also advise you to take medicines usually used for acute gout treatment. Be sure you understand your caregiver's directions. Your caregiver may make several adjustments to your medicine dose before these medicines are effective.  Discuss dietary  treatment with your caregiver or dietitian. Alcohol and drinks high in sugar and fructose and foods such as meat, poultry, and seafood can increase uric acid levels. Your caregiver or dietitian can advise you on drinks and foods that should be limited. HOME CARE INSTRUCTIONS   Do not take aspirin to relieve pain. This raises uric acid levels.  Only take over-the-counter or prescription medicines for pain, discomfort, or fever as directed by your caregiver.  Rest the joint as much as possible. When in bed, keep sheets and blankets off painful areas.  Keep the affected joint raised (elevated).  Apply warm or cold treatments to painful joints. Use of warm or cold treatments depends on which works best for you.  Use crutches if the painful joint is in your leg.  Drink enough fluids to keep your urine clear or pale yellow. This helps your body get rid of uric acid. Limit alcohol, sugary drinks, and fructose drinks.  Follow your dietary instructions. Pay careful attention to the amount of protein you eat. Your daily diet should emphasize fruits, vegetables, whole grains, and fat-free or low-fat milk products. Discuss the use of coffee, vitamin C, and cherries with your caregiver or dietitian. These may be helpful in lowering uric acid levels.  Maintain a healthy body weight. SEEK MEDICAL CARE IF:   You develop diarrhea, vomiting, or any side effects from medicines.  You do not feel better in 24 hours, or you are getting worse. SEEK IMMEDIATE MEDICAL CARE IF:   Your joint becomes suddenly more tender, and you have chills or a fever. MAKE SURE YOU:   Understand these instructions.  Will watch your condition.  Will get help right away if you are not doing well or get worse.   This information is not intended to replace advice given to you by your health care provider. Make sure you discuss any questions you have with your health care provider.   Document Released: 07/06/2000 Document  Revised: 07/30/2014 Document Reviewed: 02/20/2012 Elsevier Interactive Patient Education [employer].

## 2016-02-16 NOTE — Progress Notes (Signed)
Pre visit review using our clinic review tool, if applicable. No additional management support is needed unless otherwise documented below in the visit note. 

## 2016-02-17 DIAGNOSIS — M10071 Idiopathic gout, right ankle and foot: Secondary | ICD-10-CM | POA: Insufficient documentation

## 2016-02-17 DIAGNOSIS — M109 Gout, unspecified: Secondary | ICD-10-CM | POA: Insufficient documentation

## 2016-02-17 LAB — URIC ACID: Uric Acid, Serum: 5.7 mg/dL (ref 4.0–7.8)

## 2016-02-17 NOTE — Progress Notes (Signed)
Subjective:  Patient ID: Jeffrey Romero, male    DOB: [birth year]  Age: 65 y.o. MRN: [id number]  CC: Great toe pain, swelling  HPI:  65 year old male with hyponatremia, hypertension, OA presents with right great toe pain.  Patient states that he developed severe right great toe pain last Friday. Started suddenly. He does not recall any trauma or injury. No known inciting factor. He reports associated redness and pain particularly with range of motion. He reports some warmth. He's taken ibuprofen without relief. No other complaints at this time.  Social Hx   Social History   Social History  . Marital status: Single    Spouse name: N/A  . Number of children: N/A  . Years of education: N/A   Social History Main Topics  . Smoking status: Never Smoker  . 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 Alcohol use 0.0 oz/week     Comment: Occasionally wine or beer   . Drug use: No  . Sexual activity: No   Other Topics Concern  . None   Social History Narrative   Retired    Lives by himself    Pets: 1 dog inside    Caffeine- 1 cup coffee    Right handed    Enjoys yard work, fishing    Review of Systems  Constitutional: Negative.   Musculoskeletal:       R great toe pain.   Objective:  BP (!) 146/110 (BP Location: Left Arm, Patient Position: Sitting, Cuff Size: Large)   Pulse 83   Temp 98 F (36.7 C) (Oral)   Wt 259 lb 6 oz (117.7 kg)   SpO2 96%   BMI 38.30 kg/m   BP/Weight 02/16/2016 12/17/2014 [id number]  Systolic BP [id number]  Diastolic BP [id number] 82 88  Wt. (Lbs) 259.38 246.4 248  BMI 38.3 36.37 36.61   Physical Exam  Constitutional: He is oriented to person, place, and time. He appears well-developed. No distress.  Pulmonary/Chest: Effort normal.  Musculoskeletal:  Right great toe - mild erythema and mild warmth. Severe tenderness with palpation. Decreased range of motion secondary to pain.    Neurological: He is alert and oriented to person, place, and time.  Psychiatric: He has a normal mood and affect.  Vitals reviewed.  Lab Results  Component Value Date   WBC 5.9 11/16/2014   HGB 15.2 11/16/2014   HCT 44.9 11/16/2014   PLT 227.0 11/16/2014   GLUCOSE 102 (H) 12/17/2014   CHOL 207 (H) 12/17/2014   TRIG 208.0 (H) 12/17/2014   HDL 51.20 12/17/2014   LDLDIRECT 119.0 12/17/2014   LDLCALC 105 (H) 02/06/2011   ALT 36 12/17/2014   AST 32 12/17/2014   NA 138 12/17/2014   K 4.7 12/17/2014   CL 102 12/17/2014   CREATININE 0.79 12/17/2014   BUN 17 12/17/2014   CO2 30 12/17/2014   TSH 2.02 11/05/2012   PSA 3.73 11/16/2014   INR 1.01 08/22/2010   HGBA1C 5.3 12/17/2014   Assessment & Plan:   Problem List Items Addressed This Visit    Acute idiopathic gout of right foot - Primary    New problem. History and exam consistent with gout. Treating with prednisone. Uric acid today.      Relevant Medications   predniSONE (DELTASONE) 10 MG tablet   Other Relevant Orders   Uric acid (Completed)    Other Visit Diagnoses   None.  Meds ordered this encounter  Medications  . predniSONE (DELTASONE) 10 MG tablet    Sig: 50 mg (5 tablets) daily x 2 days, then 40 mg (4 tablets) daily x 2 days, then 30 mg (3 tablets) daily x 2 days, then 20 mg (2 tablets) daily x 2 days, then 10 mg (1 tablet) daily x 2 days.    Dispense:  30 tablet    Refill:  0   Follow-up: PRN  [place]

## 2016-02-17 NOTE — Assessment & Plan Note (Signed)
New problem. History and exam consistent with gout. Treating with prednisone. Uric acid today.

## 2016-03-01 ENCOUNTER — Ambulatory Visit (INDEPENDENT_AMBULATORY_CARE_PROVIDER_SITE_OTHER): Payer: Medicare HMO | Admitting: Family Medicine

## 2016-03-01 ENCOUNTER — Encounter: Payer: Self-pay | Admitting: Family Medicine

## 2016-03-01 ENCOUNTER — Telehealth: Payer: Self-pay | Admitting: Family Medicine

## 2016-03-01 DIAGNOSIS — H6123 Impacted cerumen, bilateral: Secondary | ICD-10-CM | POA: Diagnosis not present

## 2016-03-01 DIAGNOSIS — I10 Essential (primary) hypertension: Secondary | ICD-10-CM | POA: Insufficient documentation

## 2016-03-01 DIAGNOSIS — N400 Enlarged prostate without lower urinary tract symptoms: Secondary | ICD-10-CM | POA: Diagnosis not present

## 2016-03-01 DIAGNOSIS — E785 Hyperlipidemia, unspecified: Secondary | ICD-10-CM

## 2016-03-01 DIAGNOSIS — H6120 Impacted cerumen, unspecified ear: Secondary | ICD-10-CM | POA: Insufficient documentation

## 2016-03-01 HISTORY — DX: Benign prostatic hyperplasia without lower urinary tract symptoms: N40.0

## 2016-03-01 MED ORDER — ATORVASTATIN CALCIUM 40 MG PO TABS: 40.0000 mg | ORAL_TABLET | Freq: Every day | ORAL | 3 refills | Status: DC

## 2016-03-01 MED ORDER — ROSUVASTATIN CALCIUM 10 MG PO TABS: 10.0000 mg | ORAL_TABLET | Freq: Every day | ORAL | 3 refills | Status: DC

## 2016-03-01 MED ORDER — TAMSULOSIN HCL 0.4 MG PO CAPS: 0.4000 mg | ORAL_CAPSULE | Freq: Every day | ORAL | 3 refills | Status: DC

## 2016-03-01 NOTE — Assessment & Plan Note (Signed)
Uncontrolled. Switching to Crestor.

## 2016-03-01 NOTE — Progress Notes (Signed)
Pre visit review using our clinic review tool, if applicable. No additional management support is needed unless otherwise documented below in the visit note. 

## 2016-03-01 NOTE — Assessment & Plan Note (Signed)
BP elevated today. Patient name medication. Needs repeat eval in 2 weeks.

## 2016-03-01 NOTE — Telephone Encounter (Signed)
Patient would like a change in the prescription, please advise, thanks

## 2016-03-01 NOTE — Assessment & Plan Note (Signed)
New problem/diagnoses. Treating with Flomax.

## 2016-03-01 NOTE — Progress Notes (Signed)
Subjective:  Patient ID: Jeffrey Romero, male    DOB: 16-Jan-1951  Age: 65 y.o. MRN: [id number]  CC: Weak stream, ? Swimmer's ear  HPI:  65 year old male presents with the above complaints.  Weak urinary stream  Patient reports that he has been experiencing a week urinary stream for quite some time. This is been going on for years.  He has taken saw palmetto and over-the-counter prostate health medication without significant improvement.  Patient denies any straining, hesitancy, urgency. No reports of hematuria. No dribbling or difficulty starting.  He has never been diagnosed with BPH.  No other treatments tried.  No known exacerbating factors.  He only gets up one time a night to urinate.  ? Swimmer's ear  Patient states that he went swimming in June.  He states that since then he is felt like there has been water in his left ear.  He states that he feels like there is water sloshing around in his ear.  No reports of pain.  No drainage from the ear.  No fevers or chills.  He is concerned about infection. He would like this examined today.  Hyperlipidemia  Review of his most recent lab work reveals that his LDL is uncontrolled.  He is currently taking simvastatin.  We'll discuss switching therapy today.  Social Hx   Social History   Social History  . Marital status: Single    Spouse name: N/A  . Number of children: N/A  . Years of education: N/A   Social History Main Topics  . Smoking status: Never Smoker  . 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 Alcohol use 0.0 oz/week     Comment: Occasionally wine or beer   . Drug use: No  . Sexual activity: No   Other Topics Concern  . None   Social History Narrative   Retired    Lives by himself    Pets: 1 dog inside    Caffeine- 1 cup coffee    Right handed    Enjoys yard work, fishing    Review of Systems  Constitutional:  Negative.   HENT:       L ear "fluid".  Genitourinary:       Weak stream.    Objective:  BP (!) 164/101 (BP Location: Left Arm, Patient Position: Sitting, Cuff Size: Normal)   Pulse 70   Temp 97.6 F (36.4 C) (Oral)   Wt 259 lb 8 oz (117.7 kg)   SpO2 97%   BMI 38.32 kg/m   BP/Weight 03/01/2016 02/16/2016 [id number]  Systolic BP 123456 123456 [id number]  Diastolic BP 99991111 [id number] 82  Wt. (Lbs) 259.5 259.38 246.4  BMI 38.32 38.3 36.37   Physical Exam  Constitutional: He is oriented to person, place, and time. He appears well-developed. No distress.  HENT:  Head: Normocephalic and atraumatic.  TMs obscured by cerumen bilaterally. Normal canals.  Cardiovascular: Normal rate and regular rhythm.   Pulmonary/Chest: Effort normal. He has no wheezes. He has no rales.  Neurological: He is alert and oriented to person, place, and time.  Psychiatric: He has a normal mood and affect.  Vitals reviewed.  Lab Results  Component Value Date   WBC 5.9 11/16/2014   HGB 15.2 11/16/2014   HCT 44.9 11/16/2014   PLT 227.0 11/16/2014   GLUCOSE 102 (H) 12/17/2014   CHOL 207 (H) 12/17/2014   TRIG 208.0 (H) 12/17/2014   HDL  51.20 12/17/2014   LDLDIRECT 119.0 12/17/2014   LDLCALC 105 (H) 02/06/2011   ALT 36 12/17/2014   AST 32 12/17/2014   NA 138 12/17/2014   K 4.7 12/17/2014   CL 102 12/17/2014   CREATININE 0.79 12/17/2014   BUN 17 12/17/2014   CO2 30 12/17/2014   TSH 2.02 11/05/2012   PSA 3.73 11/16/2014   INR 1.01 08/22/2010   HGBA1C 5.3 12/17/2014    Assessment & Plan:   Problem List Items Addressed This Visit    BPH (benign prostatic hyperplasia)    New problem/diagnoses. Treating with Flomax.      Relevant Medications   tamsulosin (FLOMAX) 0.4 MG CAPS capsule   Cerumen impaction    New problem. Ears irrigated today. Resolution of the right ear obstruction. Improvement but no resolution of the left ear obstruction. Advised over-the-counter Debrox. Referral to ENT if does not resolve.        HTN (hypertension)    BP elevated today. Patient name medication. Needs repeat eval in 2 weeks.      Relevant Medications   rosuvastatin (CRESTOR) 10 MG tablet   Hyperlipidemia    Uncontrolled. Switching to Crestor.      Relevant Medications   rosuvastatin (CRESTOR) 10 MG tablet    Other Visit Diagnoses   None.     Meds ordered this encounter  Medications  . tamsulosin (FLOMAX) 0.4 MG CAPS capsule    Sig: Take 1 capsule (0.4 mg total) by mouth daily.    Dispense:  90 capsule    Refill:  3  . DISCONTD: atorvastatin (LIPITOR) 40 MG tablet    Sig: Take 1 tablet (40 mg total) by mouth daily.    Dispense:  90 tablet    Refill:  3  . rosuvastatin (CRESTOR) 10 MG tablet    Sig: Take 1 tablet (10 mg total) by mouth daily.    Dispense:  90 tablet    Refill:  3   Follow-up: Later this year.   [place]

## 2016-03-01 NOTE — Telephone Encounter (Signed)
They can switch to lipitor 40 mg daily # 90, refill 3.

## 2016-03-01 NOTE — Telephone Encounter (Signed)
Sent in the RX and notified the patient. thanks

## 2016-03-01 NOTE — Telephone Encounter (Signed)
Pt came in about the Rx rosuvastatin (CRESTOR) 10 MG tablet which was for a 90 day 141.07. Pharmacist stated for pt to get Atorvastatin which is 90 day at 4.00. Please advise?  Call pt @ [phone]. Thank you!

## 2016-03-01 NOTE — Assessment & Plan Note (Signed)
New problem. Ears irrigated today. Resolution of the right ear obstruction. Improvement but no resolution of the left ear obstruction. Advised over-the-counter Debrox. Referral to ENT if does not resolve.

## 2016-03-01 NOTE — Patient Instructions (Addendum)
Take the flomax as prescribed.  I have switched you to crestor.  Follow up later this year for a physical.  Take care  Dr. Lacinda Axon

## 2016-03-14 ENCOUNTER — Other Ambulatory Visit: Payer: Self-pay | Admitting: Family Medicine

## 2016-03-14 ENCOUNTER — Encounter: Payer: Self-pay | Admitting: Family Medicine

## 2016-03-14 MED ORDER — SIMVASTATIN 40 MG PO TABS: 40.0000 mg | ORAL_TABLET | Freq: Every day | ORAL | 3 refills | Status: DC

## 2016-05-01 DIAGNOSIS — R69 Illness, unspecified: Secondary | ICD-10-CM | POA: Diagnosis not present

## 2016-05-13 ENCOUNTER — Encounter: Payer: Self-pay | Admitting: Family Medicine

## 2016-08-24 DIAGNOSIS — M25562 Pain in left knee: Secondary | ICD-10-CM | POA: Diagnosis not present

## 2016-08-24 DIAGNOSIS — Z96653 Presence of artificial knee joint, bilateral: Secondary | ICD-10-CM | POA: Diagnosis not present

## 2016-08-24 DIAGNOSIS — Z9889 Other specified postprocedural states: Secondary | ICD-10-CM | POA: Diagnosis not present

## 2016-11-23 ENCOUNTER — Encounter: Payer: Self-pay | Admitting: Family Medicine

## 2016-11-23 ENCOUNTER — Ambulatory Visit (INDEPENDENT_AMBULATORY_CARE_PROVIDER_SITE_OTHER): Payer: Medicare HMO | Admitting: Family Medicine

## 2016-11-23 VITALS — BP 144/90 | HR 73 | Temp 97.9°F | Wt 265.0 lb

## 2016-11-23 DIAGNOSIS — Z0001 Encounter for general adult medical examination with abnormal findings: Secondary | ICD-10-CM | POA: Diagnosis not present

## 2016-11-23 DIAGNOSIS — Z23 Encounter for immunization: Secondary | ICD-10-CM | POA: Diagnosis not present

## 2016-11-23 DIAGNOSIS — I10 Essential (primary) hypertension: Secondary | ICD-10-CM

## 2016-11-23 LAB — CBC
HCT: 45.3 % (ref 39.0–52.0)
Hemoglobin: 15.2 g/dL (ref 13.0–17.0)
MCHC: 33.6 g/dL (ref 30.0–36.0)
MCV: 92.3 fl (ref 78.0–100.0)
PLATELETS: 226 10*3/uL (ref 150.0–400.0)
RBC: 4.91 Mil/uL (ref 4.22–5.81)
RDW: 13.3 % (ref 11.5–15.5)
WBC: 5.7 10*3/uL (ref 4.0–10.5)

## 2016-11-23 LAB — COMPREHENSIVE METABOLIC PANEL
ALBUMIN: 4.6 g/dL (ref 3.5–5.2)
ALK PHOS: 73 U/L (ref 39–117)
ALT: 29 U/L (ref 0–53)
AST: 29 U/L (ref 0–37)
BILIRUBIN TOTAL: 0.6 mg/dL (ref 0.2–1.2)
BUN: 15 mg/dL (ref 6–23)
CALCIUM: 9.5 mg/dL (ref 8.4–10.5)
CHLORIDE: 106 meq/L (ref 96–112)
CO2: 28 mEq/L (ref 19–32)
CREATININE: 0.89 mg/dL (ref 0.40–1.50)
GFR: 90.78 mL/min (ref >60.00)
Glucose, Bld: 105 mg/dL — ABNORMAL HIGH (ref 70–99)
POTASSIUM: 4.7 meq/L (ref 3.5–5.1)
SODIUM: 140 meq/L (ref 135–145)
Total Protein: 6.9 g/dL (ref 6.0–8.3)

## 2016-11-23 LAB — LIPID PANEL
CHOLESTEROL: 168 mg/dL (ref 0–200)
HDL: 52.5 mg/dL (ref >39.00)
LDL CALC: 90 mg/dL (ref 0–99)
NonHDL: 115.96
TRIGLYCERIDES: 129 mg/dL (ref 0.0–149.0)
Total CHOL/HDL Ratio: 3
VLDL: 25.8 mg/dL (ref 0.0–40.0)

## 2016-11-23 LAB — HEMOGLOBIN A1C: Hgb A1c MFr Bld: 5.5 % (ref 4.6–6.5)

## 2016-11-23 LAB — PSA: PSA: 4.15 ng/mL — ABNORMAL HIGH (ref 0.10–4.00)

## 2016-11-23 NOTE — Patient Instructions (Signed)
Send me your BP readings in 2 weeks.  Continue your meds.  Follow up in 6 months  Take care  Dr. Lacinda Axon    Health Maintenance, Male A healthy lifestyle and preventive care is important for your health and wellness. Ask your health care provider about what schedule of regular examinations is right for you. What should I know about weight and diet?  Eat a Healthy Diet  Eat plenty of vegetables, fruits, whole grains, low-fat dairy products, and lean protein.  Do not eat a lot of foods high in solid fats, added sugars, or salt. Maintain a Healthy Weight  Regular exercise can help you achieve or maintain a healthy weight. You should:  Do at least 150 minutes of exercise each week. The exercise should increase your heart rate and make you sweat (moderate-intensity exercise).  Do strength-training exercises at least twice a week. Watch Your Levels of Cholesterol and Blood Lipids  Have your blood tested for lipids and cholesterol every 5 years starting at 67 years of age. If you are at high risk for heart disease, you should start having your blood tested when you are 66 years old. You may need to have your cholesterol levels checked more often if:  Your lipid or cholesterol levels are high.  You are older than 67 years of age.  You are at high risk for heart disease. What should I know about cancer screening? Many types of cancers can be detected early and may often be prevented. Lung Cancer  You should be screened every year for lung cancer if:  You are a current smoker who has smoked for at least 30 years.  You are a former smoker who has quit within the past 15 years.  Talk to your health care provider about your screening options, when you should start screening, and how often you should be screened. Colorectal Cancer  Routine colorectal cancer screening usually begins at 67 years of age and should be repeated every 5-10 years until you are 67 years old. You may need to be  screened more often if early forms of precancerous polyps or small growths are found. Your health care provider may recommend screening at an earlier age if you have risk factors for colon cancer.  Your health care provider may recommend using home test kits to check for hidden blood in the stool.  A small camera at the end of a tube can be used to examine your colon (sigmoidoscopy or colonoscopy). This checks for the earliest forms of colorectal cancer. Prostate and Testicular Cancer  Depending on your age and overall health, your health care provider may do certain tests to screen for prostate and testicular cancer.  Talk to your health care provider about any symptoms or concerns you have about testicular or prostate cancer. Skin Cancer  Check your skin from head to toe regularly.  Tell your health care provider about any new moles or changes in moles, especially if:  There is a change in a mole's size, shape, or color.  You have a mole that is larger than a pencil eraser.  Always use sunscreen. Apply sunscreen liberally and repeat throughout the day.  Protect yourself by wearing long sleeves, pants, a wide-brimmed hat, and sunglasses when outside. What should I know about heart disease, diabetes, and high blood pressure?  If you are 27-54 years of age, have your blood pressure checked every 3-5 years. If you are 56 years of age or older, have your blood pressure  checked every year. You should have your blood pressure measured twice-once when you are at a hospital or clinic, and once when you are not at a hospital or clinic. Record the average of the two measurements. To check your blood pressure when you are not at a hospital or clinic, you can use:  An automated blood pressure machine at a pharmacy.  A home blood pressure monitor.  Talk to your health care provider about your target blood pressure.  If you are between 4-64 years old, ask your health care provider if you should  take aspirin to prevent heart disease.  Have regular diabetes screenings by checking your fasting blood sugar level.  If you are at a normal weight and have a low risk for diabetes, have this test once every three years after the age of 101.  If you are overweight and have a high risk for diabetes, consider being tested at a younger age or more often.  A one-time screening for abdominal aortic aneurysm (AAA) by ultrasound is recommended for men aged 83-75 years who are current or former smokers. What should I know about preventing infection? Hepatitis B  If you have a higher risk for hepatitis B, you should be screened for this virus. Talk with your health care provider to find out if you are at risk for hepatitis B infection. Hepatitis C  Blood testing is recommended for:  Everyone born from [birth year] through 1966.  Anyone with known risk factors for hepatitis C. Sexually Transmitted Diseases (STDs)  You should be screened each year for STDs including gonorrhea and chlamydia if:  You are sexually active and are younger than 67 years of age.  You are older than 67 years of age and your health care provider tells you that you are at risk for this type of infection.  Your sexual activity has changed since you were last screened and you are at an increased risk for chlamydia or gonorrhea. Ask your health care provider if you are at risk.  Talk with your health care provider about whether you are at high risk of being infected with HIV. Your health care provider may recommend a prescription medicine to help prevent HIV infection. What else can I do?  Schedule regular health, dental, and eye exams.  Stay current with your vaccines (immunizations).  Do not use any tobacco products, such as cigarettes, chewing tobacco, and e-cigarettes. If you need help quitting, ask your health care provider.  Limit alcohol intake to no more than 2 drinks per day. One drink equals 12 ounces of beer, 5 ounces  of wine, or 1 ounces of hard liquor.  Do not use street drugs.  Do not share needles.  Ask your health care provider for help if you need support or information about quitting drugs.  Tell your health care provider if you often feel depressed.  Tell your health care provider if you have ever been abused or do not feel safe at home. This information is not intended to replace advice given to you by your health care provider. Make sure you discuss any questions you have with your health care provider. Document Released: 01/05/2008 Document Revised: 03/07/2016 Document Reviewed: 04/12/2015 Elsevier Interactive Patient Education  2017 [employer].

## 2016-11-23 NOTE — Progress Notes (Signed)
Pre visit review using our clinic review tool, if applicable. No additional management support is needed unless otherwise documented below in the visit note. 

## 2016-11-23 NOTE — Assessment & Plan Note (Signed)
Pneumococcal vaccine given today. Remainder immunizations up-to-date. Remainder of preventative health up-to-date. Screening labs today. BP elevated today. Patient to send me his readings over the next 2 weeks at which point time we will discuss the need for medication.

## 2016-11-23 NOTE — Progress Notes (Signed)
Subjective:  Patient ID: Jeffrey Romero, male    DOB: 28-Nov-1950  Age: 66 y.o. MRN: [id number]  CC: Annual physical  HPI Jeffrey Romero is a 66 y.o. male presents to the clinic today for an annual physical exam.   Preventative Healthcare  Colonoscopy: up to date.   Immunizations  Tetanus - Up to date.  Pneumococcal - In need of.  Flu - Up to date  Zoster - Has had zostavax.  Labs: Labs today.  Alcohol use: See below.  Smoking/tobacco use: No.  PMH, Surgical Hx, Family Hx, Social History reviewed and updated as below.  Past Medical History:  Diagnosis Date  . BPH (benign prostatic hyperplasia) 03/01/2016  . EXTERNAL HEMORRHOIDS WITHOUT MENTION COMP   . HYPERLIPIDEMIA   . HYPERTENSION   . Osteoarthritis of knee    Past Surgical History:  Procedure Laterality Date  . Neck fusion  05 & 07  . right knee surgery  2006  . TONSILLECTOMY AND ADENOIDECTOMY    . 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 Adopted: Yes  . Family history unknown: Yes   Social History  Substance Use Topics  . Smoking status: Never Smoker  . 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 Alcohol use 0.0 oz/week     Comment: Occasionally wine or beer    Review of Systems  Musculoskeletal:       Tightness of the hands and feet.  All other systems reviewed and are negative.  Objective:   Today's Vitals: BP (!) 144/90   Pulse 73   Temp 97.9 F (36.6 C) (Oral)   Wt 265 lb (120.2 kg)   SpO2 96%   BMI 39.13 kg/m   Physical Exam  Constitutional: He is oriented to person, place, and time. He appears well-developed. No distress.  Obese.  HENT:  Head: Normocephalic and atraumatic.  Mouth/Throat: Oropharynx is clear and moist.  Eyes: Conjunctivae are normal.  Neck: Neck supple.  Cardiovascular: Normal rate and regular rhythm.   Pulmonary/Chest:  Effort normal and breath sounds normal. He has no wheezes. He has no rales.  Abdominal: Soft. He exhibits no distension. There is no tenderness. There is no rebound and no guarding.  Musculoskeletal: Normal range of motion.  Neurological: He is alert and oriented to person, place, and time.  Skin: Skin is warm. No rash noted.  Psychiatric: He has a normal mood and affect.  Vitals reviewed.  Assessment & Plan:   Problem List Items Addressed This Visit    Encounter for health maintenance examination with abnormal findings - Primary    Pneumococcal vaccine given today. Remainder immunizations up-to-date. Remainder of preventative health up-to-date. Screening labs today. BP elevated today. Patient to send me his readings over the next 2 weeks at which point time we will discuss the need for medication.      Relevant Orders   CBC   Hemoglobin A1c   Comprehensive metabolic panel   Lipid panel   PSA   HTN (hypertension)    Other Visit Diagnoses    Need for vaccination against Streptococcus pneumoniae using pneumococcal conjugate vaccine 13       Relevant Orders   Pneumococcal conjugate vaccine 13-valent (Completed)     Follow-up: Mychart message in 2 weeks with BP readings  [place]

## 2016-11-24 ENCOUNTER — Encounter: Payer: Self-pay | Admitting: Family Medicine

## 2016-11-25 ENCOUNTER — Encounter: Payer: Self-pay | Admitting: Family Medicine

## 2016-11-26 ENCOUNTER — Encounter: Payer: Self-pay | Admitting: Family Medicine

## 2016-11-26 ENCOUNTER — Other Ambulatory Visit: Payer: Self-pay | Admitting: Family Medicine

## 2016-11-26 DIAGNOSIS — R972 Elevated prostate specific antigen [PSA]: Secondary | ICD-10-CM

## 2016-11-27 ENCOUNTER — Encounter: Payer: Self-pay | Admitting: Family Medicine

## 2016-11-28 ENCOUNTER — Encounter: Payer: Self-pay | Admitting: Family Medicine

## 2016-11-29 ENCOUNTER — Encounter: Payer: Self-pay | Admitting: Family Medicine

## 2016-11-30 ENCOUNTER — Encounter: Payer: Self-pay | Admitting: Family Medicine

## 2016-12-01 ENCOUNTER — Encounter: Payer: Self-pay | Admitting: Family Medicine

## 2016-12-02 ENCOUNTER — Encounter: Payer: Self-pay | Admitting: Family Medicine

## 2016-12-03 ENCOUNTER — Encounter: Payer: Self-pay | Admitting: Family Medicine

## 2016-12-03 ENCOUNTER — Other Ambulatory Visit: Payer: Self-pay | Admitting: Family Medicine

## 2016-12-03 DIAGNOSIS — R972 Elevated prostate specific antigen [PSA]: Secondary | ICD-10-CM

## 2016-12-04 ENCOUNTER — Encounter: Payer: Self-pay | Admitting: Family Medicine

## 2016-12-05 ENCOUNTER — Encounter: Payer: Self-pay | Admitting: Family Medicine

## 2016-12-06 ENCOUNTER — Encounter: Payer: Self-pay | Admitting: Family Medicine

## 2016-12-07 ENCOUNTER — Encounter: Payer: Self-pay | Admitting: Family Medicine

## 2016-12-08 ENCOUNTER — Encounter: Payer: Self-pay | Admitting: Family Medicine

## 2016-12-13 ENCOUNTER — Other Ambulatory Visit: Payer: Self-pay | Admitting: Family Medicine

## 2016-12-13 MED ORDER — HYDROCHLOROTHIAZIDE 12.5 MG PO CAPS: 12.5000 mg | ORAL_CAPSULE | Freq: Every day | ORAL | 1 refills | Status: DC

## 2016-12-24 ENCOUNTER — Encounter: Payer: Self-pay | Admitting: Family Medicine

## 2016-12-24 NOTE — Telephone Encounter (Signed)
Patient states I nausea and dizziness comes after he takes Blood pressure medication. He takes blood pressure after eating breakfast with other medication.   Had one episode when he was out shopping and had to sit down due to dizziness and nausea. Dizziness and nausea has been going on for about 2 weeks.   Will send blood pressure reading via my chart.

## 2016-12-28 ENCOUNTER — Encounter: Payer: Self-pay | Admitting: Family Medicine

## 2017-01-04 ENCOUNTER — Encounter: Payer: Self-pay | Admitting: Family Medicine

## 2017-02-12 ENCOUNTER — Other Ambulatory Visit: Payer: Self-pay | Admitting: Family Medicine

## 2017-03-20 ENCOUNTER — Encounter: Payer: Self-pay | Admitting: Family Medicine

## 2017-04-29 ENCOUNTER — Ambulatory Visit (INDEPENDENT_AMBULATORY_CARE_PROVIDER_SITE_OTHER): Payer: Medicare HMO

## 2017-04-29 DIAGNOSIS — Z23 Encounter for immunization: Secondary | ICD-10-CM

## 2017-05-29 ENCOUNTER — Other Ambulatory Visit: Payer: Self-pay | Admitting: Family Medicine

## 2017-05-29 ENCOUNTER — Telehealth: Payer: Self-pay | Admitting: *Deleted

## 2017-05-29 NOTE — Telephone Encounter (Signed)
Last lab and last OV 5/18 can I fill til I can get patient scheduled, patient will need an appointment to transfer care to new PCP. Left message to call office.

## 2017-05-29 NOTE — Telephone Encounter (Signed)
Last lab and last OV 5/18 can I fill til I can get patient scheduled, patient will need an appointment to transfer care to new PCP.

## 2017-05-29 NOTE — Telephone Encounter (Signed)
Copied from [place] #[id number]. Topic: General - Other >> May 29, 2017  3:06 PM Ivar Drape wrote: Reason for CRM: Patient returned Cathy's telephone call.

## 2017-05-29 NOTE — Telephone Encounter (Signed)
Okay to refill until he can follow-up.  Patient does need a BMP as soon as he is able to come in to do it.  Please get him scheduled for that and place an order with a diagnosis of hypertension.  Thanks.

## 2017-06-03 NOTE — Telephone Encounter (Signed)
Talked with patient see previous note.

## 2017-06-28 ENCOUNTER — Encounter: Payer: Self-pay | Admitting: Family Medicine

## 2017-07-01 ENCOUNTER — Ambulatory Visit: Payer: Self-pay | Admitting: *Deleted

## 2017-07-01 ENCOUNTER — Encounter: Payer: Self-pay | Admitting: Emergency Medicine

## 2017-07-01 ENCOUNTER — Emergency Department
Admission: EM | Admit: 2017-07-01 | Discharge: 2017-07-01 | Disposition: A | Discharge status: Home / Self Care | Payer: Medicare HMO | Attending: Emergency Medicine | Admitting: Emergency Medicine

## 2017-07-01 ENCOUNTER — Other Ambulatory Visit: Payer: Self-pay

## 2017-07-01 DIAGNOSIS — J3489 Other specified disorders of nose and nasal sinuses: Secondary | ICD-10-CM | POA: Diagnosis present

## 2017-07-01 DIAGNOSIS — Z79899 Other long term (current) drug therapy: Secondary | ICD-10-CM | POA: Insufficient documentation

## 2017-07-01 DIAGNOSIS — I10 Essential (primary) hypertension: Secondary | ICD-10-CM | POA: Insufficient documentation

## 2017-07-01 DIAGNOSIS — J0100 Acute maxillary sinusitis, unspecified: Secondary | ICD-10-CM | POA: Insufficient documentation

## 2017-07-01 DIAGNOSIS — Z96653 Presence of artificial knee joint, bilateral: Secondary | ICD-10-CM | POA: Insufficient documentation

## 2017-07-01 DIAGNOSIS — J0110 Acute frontal sinusitis, unspecified: Secondary | ICD-10-CM | POA: Diagnosis not present

## 2017-07-01 MED ORDER — AMOXICILLIN-POT CLAVULANATE 875-125 MG PO TABS
1.0000 | ORAL_TABLET | Freq: Two times a day (BID) | ORAL | 0 refills | Status: DC
Start: 2017-07-01 — End: 2018-02-18

## 2017-07-01 MED ORDER — PREDNISONE 10 MG PO TABS: 50.0000 mg | ORAL_TABLET | Freq: Every day | ORAL | 0 refills | Status: DC

## 2017-07-01 MED ORDER — LIDOCAINE HCL (PF) 1 % IJ SOLN
2.1000 mL | Freq: Once | INTRAMUSCULAR | Status: AC
  Administered 2017-07-01: 2.1 mL
  Filled 2017-07-01: qty 5

## 2017-07-01 MED ORDER — CEFTRIAXONE SODIUM 1 G IJ SOLR
1.0000 g | Freq: Once | INTRAMUSCULAR | Status: AC
  Administered 2017-07-01: 1 g via INTRAMUSCULAR
  Filled 2017-07-01: qty 10

## 2017-07-01 MED ORDER — METHYLPREDNISOLONE SODIUM SUCC 125 MG IJ SOLR
125.0000 mg | Freq: Once | INTRAMUSCULAR | Status: AC
  Administered 2017-07-01: 125 mg via INTRAVENOUS
  Filled 2017-07-01: qty 2

## 2017-07-01 NOTE — ED Notes (Signed)
See triage note  States he developed some sinus pressure and drainage about 2 weeks ago  States the pressure is increased this am  afebriel on arrival

## 2017-07-01 NOTE — Discharge Instructions (Signed)
Follow up with your doctor if not improving over the next few days.  Return to the ER for symptoms that change or worsen if unable to schedule an appointment.

## 2017-07-01 NOTE — ED Provider Notes (Signed)
[facility] Emergency Department Provider Note  ____________________________________________  Time seen: Approximately 10:34 AM  I have reviewed the triage vital signs and the nursing notes.   HISTORY  Chief Complaint Facial Pain   HPI Jeffrey Romero is a 66 y.o. male who presents to the emergency department for evaluation of sinus pressure and drainage that started 2 weeks ago.  He states that the symptoms started out as a cold and everything has cleared with the exception of the pain and pressure in his sinuses.  Patient states that 3 days ago he began to experience pain and pressure around the left eye.  He denies vision changes.  Pain is also on the left side of his forehead.  He has taken over-the-counter Tylenol and ibuprofen without any relief.  He went to his primary care provider's office this morning for his scheduled appointment, but the office was closed due to inclement weather.  Past Medical History:  Diagnosis Date  . BPH (benign prostatic hyperplasia) 03/01/2016  . EXTERNAL HEMORRHOIDS WITHOUT MENTION COMP   . HYPERLIPIDEMIA   . HYPERTENSION   . Osteoarthritis of knee     Patient Active Problem List   Diagnosis Date Noted  . Encounter for health maintenance examination with abnormal findings 11/23/2016  . BPH (benign prostatic hyperplasia) 03/01/2016  . HTN (hypertension) 03/01/2016  . 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 Obese   . Hyperlipidemia 01/31/2009  . Osteoarthritis 07/30/2007    Past Surgical History:  Procedure Laterality Date  . Neck fusion  05 & 07  . right knee surgery  2006  . TONSILLECTOMY AND ADENOIDECTOMY    . TOTAL KNEE ARTHROPLASTY  08/2010   bilateral - olin    Prior to Admission medications   Medication Sig Start Date End Date Taking? Authorizing Provider  amoxicillin-clavulanate (AUGMENTIN) 875-125 MG tablet Take 1 tablet by mouth 2 (two) times daily. 07/01/17   Hayzlee Mcsorley, Johnette Abraham B, FNP   hydrochlorothiazide (MICROZIDE) 12.5 MG capsule TAKE 1 CAPSULE BY MOUTH EVERY DAY 05/31/17   Burnard Hawthorne, FNP  ibuprofen (ADVIL,MOTRIN) 200 MG tablet Take 200 mg by mouth as needed.     [provider]  Multiple Vitamins-Minerals (CENTRUM SILVER ADULT 50+) TABS Take by mouth daily.    [provider]  Omega-3 Fatty Acids (FISH OIL) 1200 MG CAPS Take 1,200 mg by mouth.    [provider]  predniSONE (DELTASONE) 10 MG tablet Take 5 tablets (50 mg total) by mouth daily. 07/01/17   Dhruv Christina, Johnette Abraham B, FNP  simvastatin (ZOCOR) 40 MG tablet TAKE 1 TABLET BY MOUTH AT BEDTIME 02/12/17   Cook, Marks G, DO  tamsulosin (FLOMAX) 0.4 MG CAPS capsule TAKE ONE CAPSULE BY MOUTH EVERY DAY 02/12/17   Coral Spikes, DO    Allergies Patient has no known allergies.  Family History  Adopted: Yes  Family history unknown: Yes    Social History Social History   Tobacco Use  . Smoking status: Never Smoker  . Smokeless tobacco: Never Used  . 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 Alcohol use: Yes    Alcohol/week: 0.0 oz    Comment: Occasionally wine or beer   . Drug use: No    Review of Systems Constitutional: Negative for fever/chills ENT: No sore throat.  Positive for sinus pain and pressure Cardiovascular: Denies chest pain. Respiratory: No shortness of breath.  Negative for cough. Gastrointestinal: Negative  for nausea, no vomiting.  No diarrhea.  Musculoskeletal: Negative for body aches Skin: Negative for rash. Neurological: Positive acutely ill for headaches ____________________________________________   PHYSICAL EXAM:  VITAL SIGNS: ED Triage Vitals  Enc Vitals Group     BP 07/01/17 0951 (!) 159/96     Pulse Rate 07/01/17 0951 71     Resp 07/01/17 0951 18     Temp 07/01/17 0951 98.3 F (36.8 C)     Temp Source 07/01/17 0951 Oral     SpO2 07/01/17 0951 99 %     Weight 07/01/17 0953 212  lb (96.2 kg)     Height 07/01/17 0953 5\' 9"  (1.753 m)     Head Circumference --      Peak Flow --      Pain Score 07/01/17 0951 10     Pain Loc --      Pain Edu? --      Excl. in [place]? --     Constitutional: Alert and oriented.  Acutely ill appearing and in no acute distress. Eyes: Conjunctivae are normal. EOMI. Ears: Bilateral tympanic membranes are injected but there is no loss of light reflex and no erythema. Nose: Focal tenderness elicited on palpation over the left frontal and maxillary sinus.  No rhinnorhea. Mouth/Throat: Mucous membranes are moist.  Oropharynx clear. Tonsils not visualized. Neck: No stridor.  Lymphatic: No cervical lymphadenopathy. Cardiovascular: Normal rate, regular rhythm. Good peripheral circulation. Respiratory: Normal respiratory effort.  No retractions.  Breath sounds clear to auscultation throughout. Gastrointestinal: Soft and nontender.  Musculoskeletal: FROM x 4 extremities.  Neurologic:  Normal speech and language.  Skin: Skin is warm and dry.  He has a scabbed abrasion to the central forehead. Psychiatric: Mood and affect are normal. Speech and behavior are normal.  ____________________________________________   LABS (all labs ordered are listed, but only abnormal results are displayed)  Labs Reviewed - No data to display ____________________________________________  EKG  Not indicated. ____________________________________________  RADIOLOGY  Not indicated. ____________________________________________   PROCEDURES  Procedure(s) performed: None  Critical Care performed: No ____________________________________________   INITIAL IMPRESSION / ASSESSMENT AND PLAN / ED COURSE  66 year old male presenting to the emergency department for treatment and evaluation of sinus pain and pressure that has been increasingly worse for the past 3 days.  While here, he was given an injection of Rocephin and Solu-Medrol.  He will begin Augmentin and  prednisone. He is to follow up with his PCP if not improving over the next 2-3 days. He is to return to the ER for symptoms that change or worsen if unable to schedule an appointment.  Medications  methylPREDNISolone sodium succinate (SOLU-MEDROL) 125 mg/2 mL injection 125 mg (125 mg Intravenous Given 07/01/17 1058)  cefTRIAXone (ROCEPHIN) injection 1 g (1 g Intramuscular Given 07/01/17 1058)  lidocaine (PF) (XYLOCAINE) 1 % injection 2.1 mL (2.1 mLs Other Given 07/01/17 1059)    ED Discharge Orders        Ordered    amoxicillin-clavulanate (AUGMENTIN) 875-125 MG tablet  2 times daily     07/01/17 1043    predniSONE (DELTASONE) 10 MG tablet  Daily     07/01/17 1043      Pertinent labs & imaging results that were available during my care of the patient were reviewed by me and considered in my medical decision making (see chart for details).    If controlled substance prescribed during this visit, 12 month history viewed on the [place] prior to issuing an initial prescription  for Schedule II or III opiod. ____________________________________________   FINAL CLINICAL IMPRESSION(S) / ED DIAGNOSES  Final diagnoses:  Acute non-recurrent frontal sinusitis  Acute non-recurrent maxillary sinusitis    Note:  This document was prepared using Dragon voice recognition software and may include unintentional dictation errors.     Victorino Dike, FNP 07/01/17 1120    Carrie Mew, MD 07/03/17 2325

## 2017-07-01 NOTE — ED Triage Notes (Signed)
Sinus congestion and drainage x 2 weeks, clear drainage.

## 2017-07-01 NOTE — Telephone Encounter (Signed)
Pt c/o pain and pressure in his face and left eye. Feels like there is a "vise around my eye".  He is at [facility] now, hoping that the office would be opened. He is requesting an antibiotic. He is going to [facility] at the [facility], which is close by.  Reason for Disposition . [1] Redness or swelling on the cheek, forehead or around the eye AND [2] no fever  Answer Assessment - Initial Assessment Questions 1. LOCATION: "Where does it hurt?"      Whole face, nose to left eye 2. ONSET: "When did the sinus pain start?"  (e.g., hours, days)      thursday 3. SEVERITY: "How bad is the pain?"   (Scale 1-10; mild, moderate or severe)   - MILD (1-3): doesn't interfere with normal activities    - MODERATE (4-7): interferes with normal activities (e.g., work or school) or awakens from sleep   - SEVERE (8-10): excruciating pain and patient unable to do any normal activities        10 4. RECURRENT SYMPTOM: "Have you ever had sinus problems before?" If so, ask: "When was the last time?" and "What happened that time?"      Yes, the last time was years ago 5. NASAL CONGESTION: "Is the nose blocked?" If so, ask, "Can you open it or must you breathe through the mouth?"     No not blocked 6. NASAL DISCHARGE: "Do you have discharge from your nose?" If so ask, "What color?"     no 7. FEVER: "Do you have a fever?" If so, ask: "What is it, how was it measured, and when did it start?"      no 8. OTHER SYMPTOMS: "Do you have any other symptoms?" (e.g., sore throat, cough, earache, difficulty breathing)     Tight pressure on sinuses 9. PREGNANCY: "Is there any chance you are pregnant?" "When was your last menstrual period?"     n/a  Protocols used: SINUS PAIN OR CONGESTION-A-AH

## 2017-07-03 NOTE — Telephone Encounter (Signed)
Noted.  It appears he was seen in the ED.

## 2017-07-03 NOTE — Telephone Encounter (Signed)
Patient was seen in the ED for this. No need to schedule an appointment to evaluate him. Please follow-up with him to see if he is improving. Thanks.

## 2017-07-03 NOTE — Telephone Encounter (Signed)
FYI

## 2017-07-03 NOTE — Telephone Encounter (Signed)
There are no 1:15 slots open

## 2017-07-04 ENCOUNTER — Telehealth: Payer: Self-pay

## 2017-07-04 NOTE — Telephone Encounter (Signed)
Patient states states the pain in the left eye is better and his nasal congestion is better. Patient states he has sores that are above his eye and forehead that are scabbing over now. Overall he is feeling better.

## 2017-07-04 NOTE — Telephone Encounter (Signed)
Good to hear. If he is not continuing to feel better he should follow-up.

## 2017-07-06 ENCOUNTER — Encounter: Payer: Self-pay | Admitting: Emergency Medicine

## 2017-07-06 ENCOUNTER — Other Ambulatory Visit: Payer: Self-pay

## 2017-07-06 ENCOUNTER — Emergency Department
Admission: EM | Admit: 2017-07-06 | Discharge: 2017-07-06 | Disposition: A | Discharge status: Home / Self Care | Payer: Medicare HMO | Attending: Student in an Organized Health Care Education/Training Program | Admitting: Student in an Organized Health Care Education/Training Program

## 2017-07-06 DIAGNOSIS — B0233 Zoster keratitis: Secondary | ICD-10-CM | POA: Diagnosis not present

## 2017-07-06 DIAGNOSIS — I10 Essential (primary) hypertension: Secondary | ICD-10-CM | POA: Insufficient documentation

## 2017-07-06 DIAGNOSIS — R21 Rash and other nonspecific skin eruption: Secondary | ICD-10-CM | POA: Diagnosis present

## 2017-07-06 DIAGNOSIS — Z96653 Presence of artificial knee joint, bilateral: Secondary | ICD-10-CM | POA: Insufficient documentation

## 2017-07-06 DIAGNOSIS — Z79899 Other long term (current) drug therapy: Secondary | ICD-10-CM | POA: Diagnosis not present

## 2017-07-06 MED ORDER — ACYCLOVIR 200 MG PO CAPS
400.0000 mg | ORAL_CAPSULE | Freq: Once | ORAL | Status: AC
  Administered 2017-07-06: 400 mg via ORAL
  Filled 2017-07-06: qty 2

## 2017-07-06 MED ORDER — TETRACAINE HCL 0.5 % OP SOLN
2.0000 [drp] | Freq: Once | OPHTHALMIC | Status: AC
  Administered 2017-07-06: 2 [drp] via OPHTHALMIC
  Filled 2017-07-06: qty 4

## 2017-07-06 MED ORDER — FLUORESCEIN SODIUM 1 MG OP STRP
1.0000 | ORAL_STRIP | Freq: Once | OPHTHALMIC | Status: AC
  Administered 2017-07-06: 1 via OPHTHALMIC
  Filled 2017-07-06: qty 1

## 2017-07-06 MED ORDER — ACYCLOVIR 400 MG PO TABS: 400.0000 mg | ORAL_TABLET | Freq: Every day | ORAL | 0 refills | Status: AC

## 2017-07-06 MED ORDER — POLYMYXIN B-TRIMETHOPRIM 10000-0.1 UNIT/ML-% OP SOLN: 1.0000 [drp] | OPHTHALMIC | 0 refills | Status: DC

## 2017-07-06 NOTE — ED Notes (Signed)
ED Provider at bedside. 

## 2017-07-06 NOTE — ED Provider Notes (Signed)
[facility] Emergency Department Provider Note    First MD Initiated Contact with Patient 07/06/17 [phone]     (approximate)  I have reviewed the triage vital signs and the nursing notes.   HISTORY  Chief Complaint Herpes Zoster    HPI Jeffrey Romero is a 66 y.o. male no recent hospitalizations but recent evaluation in the emergency department for left-sided facial pain previously diagnosed with sinusitis given IM and oral antibiotics as well as a prednisone pack.  He presents to the ER today with interval development of left-sided facial rash involving the nose associated with blurry vision.  States that the pain was initially severe when he was seen in the ER but has since subsided.  States the pain is only mild at this time.  No radiation.  No fevers.  Did get the shingles vaccine.  Does have a history of chickenpox.  Past Medical History:  Diagnosis Date  . BPH (benign prostatic hyperplasia) 03/01/2016  . EXTERNAL HEMORRHOIDS WITHOUT MENTION COMP   . HYPERLIPIDEMIA   . HYPERTENSION   . Osteoarthritis of knee    Family History  Adopted: Yes  Family history unknown: Yes   Past Surgical History:  Procedure Laterality Date  . Neck fusion  05 & 07  . right knee surgery  2006  . TONSILLECTOMY AND ADENOIDECTOMY    . TOTAL KNEE ARTHROPLASTY  08/2010   bilateral - olin   Patient Active Problem List   Diagnosis Date Noted  . Encounter for health maintenance examination with abnormal findings 11/23/2016  . BPH (benign prostatic hyperplasia) 03/01/2016  . HTN (hypertension) 03/01/2016  . 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 Obese   . Hyperlipidemia 01/31/2009  . Osteoarthritis 07/30/2007      Prior to Admission medications   Medication Sig Start Date End Date Taking? Authorizing Provider  acyclovir (ZOVIRAX) 400 MG tablet Take 1 tablet (400 mg total) by mouth 5 (five) times daily for 10 days. 07/06/17 07/16/17  Merlyn Lot, MD    amoxicillin-clavulanate (AUGMENTIN) 875-125 MG tablet Take 1 tablet by mouth 2 (two) times daily. 07/01/17   Triplett, Johnette Abraham B, FNP  hydrochlorothiazide (MICROZIDE) 12.5 MG capsule TAKE 1 CAPSULE BY MOUTH EVERY DAY 05/31/17   Burnard Hawthorne, FNP  ibuprofen (ADVIL,MOTRIN) 200 MG tablet Take 200 mg by mouth as needed.     [provider]  Multiple Vitamins-Minerals (CENTRUM SILVER ADULT 50+) TABS Take by mouth daily.    [provider]  Omega-3 Fatty Acids (FISH OIL) 1200 MG CAPS Take 1,200 mg by mouth.    [provider]  predniSONE (DELTASONE) 10 MG tablet Take 5 tablets (50 mg total) by mouth daily. 07/01/17   Triplett, Johnette Abraham B, FNP  simvastatin (ZOCOR) 40 MG tablet TAKE 1 TABLET BY MOUTH AT BEDTIME 02/12/17   Cook, [place] G, DO  tamsulosin (FLOMAX) 0.4 MG CAPS capsule TAKE ONE CAPSULE BY MOUTH EVERY DAY 02/12/17   Coral Spikes, DO  trimethoprim-polymyxin b (POLYTRIM) ophthalmic solution Place 1 drop into the left eye every 4 (four) hours. 07/06/17   Merlyn Lot, MD    Allergies Patient has no known allergies.    Social History Social History   Tobacco Use  . Smoking status: Never Smoker  . Smokeless tobacco: Never Used  . 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 Alcohol use: Yes    Alcohol/week: 0.0 oz  Comment: Occasionally wine or beer   . Drug use: No    Review of Systems Patient denies headaches, rhinorrhea, blurry vision, numbness, shortness of breath, chest pain, edema, cough, abdominal pain, nausea, vomiting, diarrhea, dysuria, fevers, rashes or hallucinations unless otherwise stated above in HPI. ____________________________________________   PHYSICAL EXAM:  VITAL SIGNS: Vitals:   07/06/17 0825  BP: 105/73  Pulse: 85  Resp: 16  Temp: 98.2 F (36.8 C)  SpO2: 97%    Constitutional: Alert and oriented. Well appearing and in no acute distress. Eyes: On the left  eye there is conjunctival injection and irritation without vesicles.  There is a small dendritic lesion roughly 1-42mm in size in the superior portion of the limbus. No proptosis. EOMI Head: V1 dermatome shingles rash without evidence of superimposed infection. Nose: No congestion/rhinnorhea. Mouth/Throat: Mucous membranes are moist.   Neck: No stridor. Painless ROM.  Cardiovascular: Normal rate, regular rhythm. Grossly normal heart sounds.  Good peripheral circulation. Respiratory: Normal respiratory effort.  No retractions. Lungs CTAB. Gastrointestinal: Soft and nontender. No distention. No abdominal bruits. No CVA tenderness. Genitourinary:  Musculoskeletal: No lower extremity tenderness nor edema.  No joint effusions. Neurologic:  Normal speech and language. No gross focal neurologic deficits are appreciated. No facial droop Skin:  Skin is warm, dry and intact. No rash noted. Psychiatric: Mood and affect are normal. Speech and behavior are normal.  ____________________________________________   LABS (all labs ordered are listed, but only abnormal results are displayed)  No results found for this or any previous visit (from the past 24 hour(s)). ____________________________________________ ____________________________________________   PROCEDURES  Procedure(s) performed:  Procedures    Critical Care performed: no ____________________________________________   INITIAL IMPRESSION / ASSESSMENT AND PLAN / ED COURSE  Pertinent labs & imaging results that were available during my care of the patient were reviewed by me and considered in my medical decision making (see chart for details).  DDX: shingles, zoster ophthalmicus, cellulitis  Jeffrey Romero is a 66 y.o. who presents to the ED with left eye rash and discomfort with blurry vision.  Patient has evidence of shingles with associated zoster ophthalmicus.    Clinical Course as of Jul 06 937  Sat Jul 06, 2017  0929 Left  eye is 20/50,  right eye 20/30  [PR]    Clinical Course User Index [PR] Merlyn Lot, MD  Spoke with Dr. Edison Pace of ophthalmology regarding the patient's presentation.  I did give the patient oral acyclovir here in the ER.  He is recommended adding on topical antibiotics to prevent any superimposed infection.  He will follow-up in clinic on Monday or Tuesday of this week.  Patient otherwise in no acute distress.  Stable for follow-up as an outpatient.   ____________________________________________   FINAL CLINICAL IMPRESSION(S) / ED DIAGNOSES  Final diagnoses:  Herpes zoster keratoconjunctivitis      NEW MEDICATIONS STARTED DURING THIS VISIT:  This SmartLink is deprecated. Use AVSMEDLIST instead to display the medication list for a patient.   Note:  This document was prepared using Dragon voice recognition software and may include unintentional dictation errors.    Merlyn Lot, MD 07/06/17 [phone]

## 2017-07-06 NOTE — ED Notes (Addendum)
Pt states his left eye has been constantly watering and blurred from tearing so much.  Pt states the pain has improved since Monday.  Mask applied. Rash extends from mid forehead down left side of nose.

## 2017-07-06 NOTE — Discharge Instructions (Signed)
Follow up with [place] [facility].  Call office on Monday to schedule early follow up appointment with Dr. Edison Pace.  Return for worsening pain, blurry vision or fevers.

## 2017-07-06 NOTE — ED Triage Notes (Signed)
Pt c/o pressure to left eye states he was seen here on Monday for the same, but states now he broke out on his face. Rash on face and nose resemble shingles.    Pt states he has had shingles vaccination in the past.

## 2017-07-06 NOTE — ED Notes (Signed)
Robinson MD at bedside   

## 2017-07-08 DIAGNOSIS — B029 Zoster without complications: Secondary | ICD-10-CM | POA: Diagnosis not present

## 2017-07-15 NOTE — Telephone Encounter (Signed)
Patient was seen in the ED. 

## 2017-07-19 DIAGNOSIS — B028 Zoster with other complications: Secondary | ICD-10-CM | POA: Diagnosis not present

## 2017-07-30 DIAGNOSIS — B028 Zoster with other complications: Secondary | ICD-10-CM | POA: Diagnosis not present

## 2017-08-13 ENCOUNTER — Other Ambulatory Visit: Payer: Self-pay

## 2017-08-13 DIAGNOSIS — B028 Zoster with other complications: Secondary | ICD-10-CM | POA: Diagnosis not present

## 2017-08-13 NOTE — Telephone Encounter (Signed)
Last OV with Dr.Cook 11/23/16 last filled by Dr.Cook  Simvastatin 02/12/17 90 1rf Tamsulosin 02/12/17 90 1rf

## 2017-08-14 ENCOUNTER — Other Ambulatory Visit: Payer: Self-pay | Admitting: Family Medicine

## 2017-08-15 MED ORDER — SIMVASTATIN 40 MG PO TABS: 40.0000 mg | ORAL_TABLET | Freq: Every day | ORAL | 0 refills | Status: DC

## 2017-08-15 MED ORDER — TAMSULOSIN HCL 0.4 MG PO CAPS: 0.4000 mg | ORAL_CAPSULE | Freq: Every day | ORAL | 0 refills | Status: DC

## 2017-08-15 NOTE — Telephone Encounter (Signed)
Sent to pharmacy.  Patient needs a follow-up visit set up to establish care.  Please also check to see if he saw urology as he was previously referred.  Thanks.

## 2017-08-19 ENCOUNTER — Encounter: Payer: Self-pay | Admitting: *Deleted

## 2017-08-19 NOTE — Telephone Encounter (Signed)
sent mychart message.

## 2017-08-22 ENCOUNTER — Encounter: Payer: Self-pay | Admitting: Internal Medicine

## 2017-09-04 DIAGNOSIS — B028 Zoster with other complications: Secondary | ICD-10-CM | POA: Diagnosis not present

## 2017-11-06 DIAGNOSIS — B028 Zoster with other complications: Secondary | ICD-10-CM | POA: Diagnosis not present

## 2017-11-27 ENCOUNTER — Other Ambulatory Visit: Payer: Self-pay | Admitting: Family

## 2017-11-29 NOTE — Telephone Encounter (Signed)
Last office visit Dr Lacinda Axon 11/23/16 No office visit scheduled  Last filled 08/21/17

## 2017-12-01 NOTE — Telephone Encounter (Signed)
Routing to his pcp, sonnenberg

## 2017-12-02 ENCOUNTER — Other Ambulatory Visit: Payer: Self-pay | Admitting: Family Medicine

## 2017-12-31 ENCOUNTER — Encounter: Payer: Self-pay | Admitting: Family Medicine

## 2017-12-31 MED ORDER — TAMSULOSIN HCL 0.4 MG PO CAPS: 0.4000 mg | ORAL_CAPSULE | Freq: Every day | ORAL | 0 refills | Status: DC

## 2018-01-23 ENCOUNTER — Other Ambulatory Visit: Payer: Self-pay | Admitting: Family Medicine

## 2018-01-24 NOTE — Telephone Encounter (Signed)
1 month refill sent in.  This will be the last refill I provide until he schedules follow-up.  Please let him know this.

## 2018-01-24 NOTE — Telephone Encounter (Signed)
Refilled: 12/31/2017 Last OV: 11/23/2016 Next OV: not scheduled

## 2018-01-27 DIAGNOSIS — R972 Elevated prostate specific antigen [PSA]: Secondary | ICD-10-CM | POA: Diagnosis not present

## 2018-02-06 NOTE — Telephone Encounter (Signed)
Patient is scheduled   

## 2018-02-18 ENCOUNTER — Telehealth: Payer: Self-pay | Admitting: Family Medicine

## 2018-02-18 ENCOUNTER — Ambulatory Visit (INDEPENDENT_AMBULATORY_CARE_PROVIDER_SITE_OTHER): Payer: Medicare HMO | Admitting: Family Medicine

## 2018-02-18 ENCOUNTER — Encounter: Payer: Self-pay | Admitting: Family Medicine

## 2018-02-18 VITALS — BP 128/78 | HR 67 | Temp 97.4°F | Ht 69.0 in | Wt 204.8 lb

## 2018-02-18 DIAGNOSIS — Z1212 Encounter for screening for malignant neoplasm of rectum: Secondary | ICD-10-CM

## 2018-02-18 DIAGNOSIS — B0233 Zoster keratitis: Secondary | ICD-10-CM

## 2018-02-18 DIAGNOSIS — B029 Zoster without complications: Secondary | ICD-10-CM | POA: Insufficient documentation

## 2018-02-18 DIAGNOSIS — E785 Hyperlipidemia, unspecified: Secondary | ICD-10-CM

## 2018-02-18 DIAGNOSIS — I10 Essential (primary) hypertension: Secondary | ICD-10-CM | POA: Diagnosis not present

## 2018-02-18 DIAGNOSIS — Z1211 Encounter for screening for malignant neoplasm of colon: Secondary | ICD-10-CM

## 2018-02-18 DIAGNOSIS — N400 Enlarged prostate without lower urinary tract symptoms: Secondary | ICD-10-CM | POA: Diagnosis not present

## 2018-02-18 LAB — COMPREHENSIVE METABOLIC PANEL
ALK PHOS: 53 U/L (ref 39–117)
ALT: 42 U/L (ref 0–53)
AST: 31 U/L (ref 0–37)
Albumin: 4.6 g/dL (ref 3.5–5.2)
BUN: 15 mg/dL (ref 6–23)
CHLORIDE: 102 meq/L (ref 96–112)
CO2: 31 mEq/L (ref 19–32)
Calcium: 9.8 mg/dL (ref 8.4–10.5)
Creatinine, Ser: 0.81 mg/dL (ref 0.40–1.50)
GFR: 100.82 mL/min (ref >60.00)
GLUCOSE: 102 mg/dL — AB (ref 70–99)
POTASSIUM: 4.1 meq/L (ref 3.5–5.1)
Sodium: 140 mEq/L (ref 135–145)
Total Bilirubin: 1.1 mg/dL (ref 0.2–1.2)
Total Protein: 7.1 g/dL (ref 6.0–8.3)

## 2018-02-18 LAB — LIPID PANEL
CHOL/HDL RATIO: 3
Cholesterol: 140 mg/dL (ref 0–200)
HDL: 54.2 mg/dL (ref >39.00)
LDL CALC: 55 mg/dL (ref 0–99)
NONHDL: 85.66
Triglycerides: 153 mg/dL — ABNORMAL HIGH (ref 0.0–149.0)
VLDL: 30.6 mg/dL (ref 0.0–40.0)

## 2018-02-18 LAB — HEMOGLOBIN A1C: Hgb A1c MFr Bld: 5.3 % (ref 4.6–6.5)

## 2018-02-18 MED ORDER — PNEUMOCOCCAL VAC POLYVALENT 25 MCG/0.5ML IJ INJ: 0.5000 mL | INJECTION | Freq: Once | INTRAMUSCULAR | 0 refills | Status: AC

## 2018-02-18 NOTE — Assessment & Plan Note (Signed)
Controlled on recheck.  He will start checking daily for the next 2 to 3 weeks and let us know what it is.  He is interested in coming off of medication.

## 2018-02-18 NOTE — Assessment & Plan Note (Signed)
Check lipid panel.  Continue simvastatin. 

## 2018-02-18 NOTE — Assessment & Plan Note (Signed)
Continue Flomax.  He will follow-up with urology as planned.

## 2018-02-18 NOTE — Patient Instructions (Signed)
Nice to see you. Please check your blood pressure daily for the next 2 to 3 weeks and contact us with the numbers. We will check with our clinical pharmacist regarding the shingles vaccine given that she had shingles last December. Please keep your appointment with urology.

## 2018-02-18 NOTE — Telephone Encounter (Signed)
-----   Message from De Hollingshead, [facility] sent at 02/18/2018  8:53 AM EDT ----- Dr. Caryl Bis,   You are correct - the current recommendation is that as long as the acute rash phase has resolved, the patient is OK to start the Shingrix series. I've heard that grocery store pharmacies are typically more likely to have Shingrix in [place], [place]!  Thanks!  Catie   ----- Message ----- From: [name], MD Sent: 02/18/2018   8:29 AM To: De Hollingshead, [place] Catie,   This patient had shingles on his face in December 2018. He is interested in getting shingrix, though he states the ED physician advised he could not get it for a year. Based on looking at the [facility] website it looks like there is no time frame for when patients can get shingrix after shingles. Do you know if there is a specific time frame patients have to wait for this? Thanks.   Randall Hiss

## 2018-02-18 NOTE — Telephone Encounter (Signed)
Please let the patient know I heard back from our clinical pharmacist and he soul be able to go ahead with the shingrix vaccine given that his acute rash has resolved. She also noted that grocery store pharmacies are more likely to have the vaccine in stock so he may want to check with Kristopher Oppenheim.

## 2018-02-18 NOTE — Telephone Encounter (Signed)
Patient notified

## 2018-02-18 NOTE — Assessment & Plan Note (Signed)
Symptoms have resolved.  He is interested in getting Shingrix.  We will check with our clinical pharmacist regarding when he can get this following a shingles outbreak.

## 2018-02-18 NOTE — Progress Notes (Signed)
  [name], MD Phone: [phone]  Jeffrey Romero is a 67 y.o. male who presents today for f/u.  CC: htn, hld, bph, shingles  HYPERTENSION  Disease Monitoring  Home BP Monitoring not checking Chest pain- no    Dyspnea- no Medications  Compliance-  Taking HCTZ. Edema- no Patient has been working on exercise and has lost weight related to this.  He is walking 2 to 3 miles per day.  HYPERLIPIDEMIA Symptoms Chest pain on exertion:  no   Medications: Compliance- taking simvastatin Right upper quadrant pain- no  Muscle aches- no  BPH: Strain- no Flow- good Nocturia- no Emptying bladder- yes Medication- flomax Patient is with urology for increasing PSA.  They are planning on doing a biopsy.  This is scheduled for August.  Shingles: had shingles on his face.  This got into his left eye.  He saw ophthalmology for this.  Has progressively improved.  Vision is improving.  He sees them again in September.    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  HENT:  No rash on face, scars noted from prior shingles  Eyes: Pupils are equal, round, and reactive to light. Conjunctivae are normal.  Cardiovascular: Normal rate, regular rhythm and normal heart sounds.  Pulmonary/Chest: Effort normal and breath sounds normal.  Musculoskeletal: He exhibits no edema.  Neurological: He is alert.    Skin: Skin is warm and dry. He is not diaphoretic.     Assessment/Plan: Please see individual problem list.  Hyperlipidemia Check lipid panel.  Continue simvastatin.  BPH (benign prostatic hyperplasia) Continue Flomax.  He will follow-up with urology as planned.  HTN (hypertension) Controlled on recheck.  He will start checking daily for the next 2 to 3 weeks and let us know what it is.  He is interested in coming off of medication.  Shingles Symptoms have resolved.  He is interested in getting Shingrix.  We will check with our clinical pharmacist regarding when he can get this following a shingles outbreak.   Health Maintenance: cologuard ordered. Pneumovax prescription given.  Orders Placed This Encounter  Procedures  . Cologuard    Standing Status:   Future    Standing Expiration Date:   02/19/2019  . Comp Met (CMET)  . Lipid panel  . HgB A1c    Meds ordered this encounter  Medications  . pneumococcal 23 valent vaccine (PNU-IMMUNE) 25 MCG/0.5ML injection    Sig: Inject 0.5 mLs into the muscle once for 1 dose.    Dispense:  0.5 mL    Refill:  0     [name], MD [place]

## 2018-02-19 ENCOUNTER — Other Ambulatory Visit: Payer: Self-pay | Admitting: Family Medicine

## 2018-02-20 DIAGNOSIS — R69 Illness, unspecified: Secondary | ICD-10-CM | POA: Diagnosis not present

## 2018-02-21 ENCOUNTER — Encounter: Payer: Self-pay | Admitting: Family Medicine

## 2018-02-24 DIAGNOSIS — Z1211 Encounter for screening for malignant neoplasm of colon: Secondary | ICD-10-CM | POA: Diagnosis not present

## 2018-02-24 DIAGNOSIS — Z1212 Encounter for screening for malignant neoplasm of rectum: Secondary | ICD-10-CM | POA: Diagnosis not present

## 2018-02-24 LAB — COLOGUARD: Cologuard: NEGATIVE

## 2018-02-25 ENCOUNTER — Other Ambulatory Visit: Payer: Self-pay

## 2018-02-25 MED ORDER — HYDROCHLOROTHIAZIDE 12.5 MG PO CAPS: ORAL_CAPSULE | ORAL | 1 refills | Status: DC

## 2018-02-25 NOTE — Telephone Encounter (Signed)
Last OV 02/18/18 last filled by Dr.Mclean 12/02/17 90 0rf

## 2018-03-05 ENCOUNTER — Encounter: Payer: Self-pay | Admitting: Family Medicine

## 2018-03-06 ENCOUNTER — Encounter: Payer: Self-pay | Admitting: Family Medicine

## 2018-03-06 DIAGNOSIS — R69 Illness, unspecified: Secondary | ICD-10-CM | POA: Diagnosis not present

## 2018-03-10 ENCOUNTER — Encounter: Payer: Self-pay | Admitting: Family Medicine

## 2018-03-10 DIAGNOSIS — E785 Hyperlipidemia, unspecified: Secondary | ICD-10-CM

## 2018-03-11 ENCOUNTER — Telehealth: Payer: Self-pay | Admitting: Family Medicine

## 2018-03-11 DIAGNOSIS — R972 Elevated prostate specific antigen [PSA]: Secondary | ICD-10-CM | POA: Diagnosis not present

## 2018-03-11 DIAGNOSIS — C61 Malignant neoplasm of prostate: Secondary | ICD-10-CM | POA: Diagnosis not present

## 2018-03-11 NOTE — Telephone Encounter (Signed)
Please let the patient know his Cologuard was negative.  Thanks.

## 2018-03-12 NOTE — Telephone Encounter (Signed)
Sent mychart message to notify 

## 2018-03-13 MED FILL — SHINGRIX 50 MCG SUS: 50 | 1 days supply | Qty: 1 | Fill #0

## 2018-03-20 DIAGNOSIS — C61 Malignant neoplasm of prostate: Secondary | ICD-10-CM | POA: Diagnosis not present

## 2018-04-01 DIAGNOSIS — C61 Malignant neoplasm of prostate: Secondary | ICD-10-CM | POA: Diagnosis not present

## 2018-04-14 ENCOUNTER — Other Ambulatory Visit (INDEPENDENT_AMBULATORY_CARE_PROVIDER_SITE_OTHER): Payer: Medicare HMO

## 2018-04-14 DIAGNOSIS — E785 Hyperlipidemia, unspecified: Secondary | ICD-10-CM

## 2018-04-14 LAB — LDL CHOLESTEROL, DIRECT: LDL DIRECT: 112 mg/dL

## 2018-04-15 ENCOUNTER — Encounter: Payer: Self-pay | Admitting: Family Medicine

## 2018-04-16 ENCOUNTER — Other Ambulatory Visit: Payer: Self-pay

## 2018-04-16 MED ORDER — SIMVASTATIN 40 MG PO TABS: 40.0000 mg | ORAL_TABLET | Freq: Every day | ORAL | 0 refills | Status: DC

## 2018-05-02 DIAGNOSIS — C61 Malignant neoplasm of prostate: Secondary | ICD-10-CM | POA: Diagnosis not present

## 2018-05-14 DIAGNOSIS — B028 Zoster with other complications: Secondary | ICD-10-CM | POA: Diagnosis not present

## 2018-06-01 ENCOUNTER — Other Ambulatory Visit: Payer: Self-pay | Admitting: Family Medicine

## 2018-06-02 MED FILL — SHINGRIX 50 MCG SUS: 50 | 1 days supply | Qty: 1 | Fill #1

## 2018-06-10 ENCOUNTER — Encounter: Payer: Self-pay | Admitting: Radiation Oncology

## 2018-06-10 NOTE — Progress Notes (Addendum)
GU Location of Tumor / Histology: prostatic adenocarcinoma  If Prostate Cancer, Gleason Score is (3 + 3) and PSA is (4.58). Prostate volume: 59.3 grams.   Jeffrey Romero was referred by DR. Forbes Cellar Sonnenberg to Dr. Alyson Ingles for further evaluation of an elevated PSA on July 2019. Reports his PCP, Dr. Caryl Bis, has been monitoring his PSA since 2012.   Biopsies of prostate (if applicable) revealed:    Past/Anticipated interventions by urology, if any: prostate biopsy, referral to radiation oncology for consideration of brachytherapy  Past/Anticipated interventions by medical oncology, if any: no  Weight changes, if any: no  Bowel/Bladder complaints, if any: IPSS 5. SHIM 20. Denies dysuria, hematuria, urinary leakage or incontinence. Reports weak urine stream.  Reports taking Flomax x 2 years.   Nausea/Vomiting, if any: no  Pain issues, if any:  no  SAFETY ISSUES:  Prior radiation? no  Pacemaker/ICD? no  Possible current pregnancy? no  Is the patient on methotrexate? no  Current Complaints / other details:  67 year old male. Divorced with one son. Patient was adopted thus no family hx.

## 2018-06-11 ENCOUNTER — Ambulatory Visit
Admission: RE | Admit: 2018-06-11 | Discharge: 2018-06-11 | Disposition: A | Discharge status: Home / Self Care | Payer: Medicare HMO | Source: Ambulatory Visit | Attending: Radiation Oncology | Admitting: Radiation Oncology

## 2018-06-11 ENCOUNTER — Encounter: Payer: Self-pay | Admitting: Radiation Oncology

## 2018-06-11 ENCOUNTER — Other Ambulatory Visit: Payer: Self-pay

## 2018-06-11 DIAGNOSIS — Z79899 Other long term (current) drug therapy: Secondary | ICD-10-CM | POA: Insufficient documentation

## 2018-06-11 DIAGNOSIS — I10 Essential (primary) hypertension: Secondary | ICD-10-CM | POA: Diagnosis not present

## 2018-06-11 DIAGNOSIS — C61 Malignant neoplasm of prostate: Secondary | ICD-10-CM

## 2018-06-11 DIAGNOSIS — R972 Elevated prostate specific antigen [PSA]: Secondary | ICD-10-CM | POA: Diagnosis not present

## 2018-06-11 DIAGNOSIS — Z8546 Personal history of malignant neoplasm of prostate: Secondary | ICD-10-CM | POA: Insufficient documentation

## 2018-06-11 HISTORY — DX: Malignant neoplasm of prostate: C61

## 2018-06-11 NOTE — Progress Notes (Signed)
See progress note under physician encounter. 

## 2018-06-11 NOTE — Progress Notes (Signed)
Radiation Oncology         [phone] ________________________________  Initial outpatient Consultation  Name: Jeffrey Romero MRN: [id number]  Date: 06/11/2018  DOB: [birth year]  CC:Jeffrey Haven, MD  Romero, Jeffrey Furbish, MD   REFERRING PHYSICIAN: Cleon Gustin, MD  DIAGNOSIS: 67 y.o. gentleman with Stage T1c adenocarcinoma of the prostate with Gleason score of 3+3, and PSA of 4.58.    ICD-10-CM   1. Malignant neoplasm of prostate ([place]) C61     HISTORY OF PRESENT ILLNESS: Jeffrey Romero is a 67 y.o. male with a diagnosis of prostate cancer. He was noted to have an elevated PSA of 4.15 by his primary care physician, Dr. Caryl Romero, who has been monitoring the patient's PSA since 2012.  Accordingly, he was referred for evaluation in urology by Dr. Alyson Romero on 01/27/18,  digital rectal examination was performed at that time revealing symmetrical lobes.   PSA was repeated and noted to remain elevated at 4.58.  Therefore, the patient proceeded to transrectal ultrasound with 12 biopsies of the prostate on 03/11/18.  The prostate volume measured 59.3 cc. Out of 12 core biopsies, 2 were positive.  The maximum Gleason score was 3+3, and this was seen in the right apex and right apex lateral.  The patient reviewed the biopsy results with his urologist and he has kindly been referred today for discussion of potential radiation treatment options.  Initially, he was leaning towards proceeding with active surveillance but later decided that he was not comfortable with that path and met with Dr. Tresa Romero to discuss RALP.  He decided against prostatectomy and is now interested in learning more regarding radiation options, particularly brachytherapy.  PREVIOUS RADIATION THERAPY: No  PAST MEDICAL HISTORY:  Past Medical History:  Diagnosis Date  . BPH (benign prostatic hyperplasia) 03/01/2016  . EXTERNAL HEMORRHOIDS WITHOUT MENTION COMP   . HYPERLIPIDEMIA   . HYPERTENSION   . Osteoarthritis  of knee   . Prostate cancer ([place])       PAST SURGICAL HISTORY: Past Surgical History:  Procedure Laterality Date  . Neck fusion  05 & 07  . PROSTATE BIOPSY    . right knee surgery  2006  . TONSILLECTOMY AND ADENOIDECTOMY    . TOTAL KNEE ARTHROPLASTY  08/2010   bilateral - olin    FAMILY HISTORY:  Family History  Adopted: Yes  Family history unknown: Yes    SOCIAL HISTORY:  Social History   Socioeconomic History  . Marital status: Single    Spouse name: Not on file  . Number of children: 1  . Years of education: Not on file  . Highest education level: Not on file  Occupational History  . Not on file  Social Needs  . Financial resource strain: Not on file  . Food insecurity:    Worry: Not on file    Inability: Not on file  . Transportation needs:    Medical: Not on file    Non-medical: Not on file  Tobacco Use  . Smoking status: Never Smoker  . Smokeless tobacco: Never Used  . 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 Alcohol use: Yes    Alcohol/week: 0.0 standard drinks    Comment: Occasionally wine or beer   . Drug use: No  . Sexual activity: Not Currently  Lifestyle  . Physical activity:    Days per week: Not on file    Minutes per session:  Not on file  . Stress: Not on file  Relationships  . Social connections:    Talks on phone: Not on file    Gets together: Not on file    Attends religious service: Not on file    Active member of club or organization: Not on file    Attends meetings of clubs or organizations: Not on file    Relationship status: Not on file  . Intimate partner violence:    Fear of current or ex partner: Not on file    Emotionally abused: Not on file    Physically abused: Not on file    Forced sexual activity: Not on file  Other Topics Concern  . Not on file  Social History Narrative   Retired    Lives by himself    Pets: 1 dog inside    Caffeine- 1  cup coffee    Right handed    Enjoys yard work, fishing     ALLERGIES: Patient has no known allergies.  MEDICATIONS:  Current Outpatient Medications  Medication Sig Dispense Refill  . hydrochlorothiazide (MICROZIDE) 12.5 MG capsule TAKE 1 CAPSULE BY MOUTH EVERY DAY 90 capsule 1  . Omega-3 Fatty Acids (FISH OIL) 1200 MG CAPS Take 1,200 mg by mouth.    . simvastatin (ZOCOR) 20 MG tablet simvastatin 20 mg tablet    . tamsulosin (FLOMAX) 0.4 MG CAPS capsule TAKE 1 CAPSULE BY MOUTH EVERY DAY 90 capsule 1  . FLUAD 0.5 ML SUSY TO BE ADMINISTERED BY PHARMACIST FOR IMMUNIZATION  0  . ibuprofen (ADVIL,MOTRIN) 200 MG tablet Take 200 mg by mouth as needed.      No current facility-administered medications for this encounter.     REVIEW OF SYSTEMS:  On review of systems, the patient reports that he is doing well overall. He denies any chest pain, shortness of breath, cough, fevers, chills, night sweats, unintended weight changes. He denies any bowel disturbances, and denies abdominal pain, nausea or vomiting. He denies any new musculoskeletal or joint aches or pains. His IPSS was 5, indicating mild urinary symptoms. He reports weak urine stream and has been taking Flomax for 2 years.  His SHIM was 20, indicating he has mild erectile dysfunction. A complete review of systems is obtained and is otherwise negative.    PHYSICAL EXAM:  Wt Readings from Last 3 Encounters:  06/11/18 210 lb 4 oz (95.4 kg)  02/18/18 204 lb 12.8 oz (92.9 kg)  07/06/17 215 lb (97.5 kg)   Temp Readings from Last 3 Encounters:  06/11/18 98.1 F (36.7 C) (Oral)  02/18/18 (!) 97.4 F (36.3 C) (Oral)  07/06/17 98.2 F (36.8 C) (Oral)   BP Readings from Last 3 Encounters:  06/11/18 (!) 141/87  02/18/18 128/78  07/06/17 105/73   Pulse Readings from Last 3 Encounters:  06/11/18 67  02/18/18 67  07/06/17 85   Pain Assessment Pain Score: 0-No pain/10  In general this is a well appearing Caucasian gentleman in no  acute distress. He is alert and oriented x4 and appropriate throughout the examination. HEENT reveals that the patient is normocephalic, atraumatic. EOMs are intact. PERRLA. Skin is intact without any evidence of gross lesions. Cardiovascular exam reveals a regular rate and rhythm, no clicks rubs or murmurs are auscultated. Chest is clear to auscultation bilaterally. Lymphatic assessment is performed and does not reveal any adenopathy in the cervical, supraclavicular, axillary, or inguinal chains. Abdomen has active bowel sounds in all quadrants and is intact. The abdomen is  soft, non tender, non distended. Lower extremities are negative for pretibial pitting edema, deep calf tenderness, cyanosis or clubbing.   KPS = 100  100 - Normal; no complaints; no evidence of disease. 90   - Able to carry on normal activity; minor signs or symptoms of disease. 80   - Normal activity with effort; some signs or symptoms of disease. 61   - Cares for self; unable to carry on normal activity or to do active work. 60   - Requires occasional assistance, but is able to care for most of his personal needs. 50   - Requires considerable assistance and frequent medical care. 16   - Disabled; requires special care and assistance. 36   - Severely disabled; hospital admission is indicated although death not imminent. 82   - Very sick; hospital admission necessary; active supportive treatment necessary. 10   - Moribund; fatal processes progressing rapidly. 0     - Dead  Karnofsky DA, Abelmann [place], Craver LS and Burchenal [facility] [phone]) The use of the nitrogen mustards in the palliative treatment of carcinoma: with particular reference to bronchogenic carcinoma Cancer 1 634-56  LABORATORY DATA:  Lab Results  Component Value Date   WBC 5.7 11/23/2016   HGB 15.2 11/23/2016   HCT 45.3 11/23/2016   MCV 92.3 11/23/2016   PLT 226.0 11/23/2016   Lab Results  Component Value Date   NA 140 02/18/2018   K 4.1 02/18/2018   CL  102 02/18/2018   CO2 31 02/18/2018   Lab Results  Component Value Date   ALT 42 02/18/2018   AST 31 02/18/2018   ALKPHOS 53 02/18/2018   BILITOT 1.1 02/18/2018     RADIOGRAPHY: No results found.    IMPRESSION/PLAN: 1. 67 y.o. gentleman with Stage T1c adenocarcinoma of the prostate with Gleason Score of 3+3, and PSA of 4.58. We discussed the patient's workup and outlined the nature of prostate cancer in this setting. The patient's T stage, Gleason's score, and PSA put him into the low risk group. Accordingly, he is eligible for a variety of potential treatment options including active surveillance, brachytherapy, or 5.5 weeks of external radiation. We discussed the available radiation techniques, and focused on the details and logistics and delivery.  We discussed and outlined the risks, benefits, short and long-term effects associated with radiotherapy and compared and contrasted these with prostatectomy. We discussed the role of SpaceOAR in reducing the rectal toxicity associated with radiotherapy.   At the conclusion of our conversation, the patient is interested in moving forward with brachytherapy and use of SpaceOAR to reduce rectal toxicity from radiotherapy.  We will share our discussion with Dr. Alyson Romero and move forward with scheduling his CT [facility] planning appointment in the near future.  The patient met briefly with Romie Jumper in our office who will be working closely with him to coordinate OR scheduling and pre and post procedure appointments.  We will contact the pharmaceutical rep to ensure that [place] is available at the time of procedure.  He will have a prostate MRI following his post-seed CT SIM to confirm appropriate distribution of the [place].  We spent 60 minutes face to face with the patient and more than 50% of that time was spent in counseling and/or coordination of care.    Nicholos Johns, PA-C    Tyler Pita, MD  [facility] Direct Dial: [phone]  Fax: [phone] [email or web address]  Skype  LinkedIn  This document serves as a  record of services personally performed by Tyler Pita, MD and Freeman Caldron, PA-C. It was created on their behalf by Wilburn Mylar, a trained medical scribe. The creation of this record is based on the scribe's personal observations and the provider's statements to them. This document has been checked and approved by the attending provider.

## 2018-06-16 ENCOUNTER — Telehealth: Payer: Self-pay | Admitting: *Deleted

## 2018-06-16 NOTE — Telephone Encounter (Signed)
CALLED PATIENT TO INFORM OF PRE-SEED PLANNING CT FOR 06-27-18, SPOKE WITH PATIENT AND HE IS AWARE OF THESE APPTS.

## 2018-06-24 ENCOUNTER — Encounter: Payer: Self-pay | Admitting: Medical Oncology

## 2018-06-26 ENCOUNTER — Telehealth: Payer: Self-pay | Admitting: *Deleted

## 2018-06-26 NOTE — Telephone Encounter (Signed)
Called patient to remind of pre-seed planning Ct for 06-27-18, spoke with patient and he is aware of this appt.

## 2018-06-27 ENCOUNTER — Ambulatory Visit
Admission: RE | Admit: 2018-06-27 | Discharge: 2018-06-27 | Disposition: A | Discharge status: Home / Self Care | Payer: Medicare HMO | Source: Ambulatory Visit | Attending: Radiation Oncology | Admitting: Radiation Oncology

## 2018-06-27 ENCOUNTER — Encounter: Payer: Self-pay | Admitting: Medical Oncology

## 2018-06-27 VITALS — BP 121/74 | HR 77 | Temp 97.7°F | Resp 20 | Ht 69.0 in | Wt 213.0 lb

## 2018-06-27 DIAGNOSIS — I10 Essential (primary) hypertension: Secondary | ICD-10-CM | POA: Diagnosis not present

## 2018-06-27 DIAGNOSIS — C61 Malignant neoplasm of prostate: Secondary | ICD-10-CM | POA: Insufficient documentation

## 2018-06-27 DIAGNOSIS — Z79899 Other long term (current) drug therapy: Secondary | ICD-10-CM | POA: Diagnosis not present

## 2018-06-27 NOTE — Progress Notes (Signed)
  Radiation Oncology         [phone] ________________________________  Name: Jeffrey Romero MRN: [id number]  Date: 06/27/2018  DOB: 1951/04/06  SIMULATION AND TREATMENT PLANNING NOTE PUBIC ARCH STUDY  IP:[id number], Angela Adam, MD  [name], MD  DIAGNOSIS: 66 y.o. gentleman with Stage T1c adenocarcinoma of the prostate with Gleason score of 3+3, and PSA of 4.58.     ICD-10-CM   1. Malignant neoplasm of prostate ([place]) C61     COMPLEX SIMULATION:  The patient presented today for evaluation for possible prostate seed implant. He was brought to the radiation planning suite and placed supine on the CT couch. A 3-dimensional image study set was obtained in upload to the planning computer. There, on each axial slice, I contoured the prostate gland. Then, using three-dimensional radiation planning tools I reconstructed the prostate in view of the structures from the transperineal needle pathway to assess for possible pubic arch interference. In doing so, I did not appreciate any pubic arch interference. Also, the patient's prostate volume was estimated based on the drawn structure. The volume was 56 cc.  Given the pubic arch appearance and prostate volume, patient remains a good candidate to proceed with prostate seed implant. Today, he freely provided informed written consent to proceed.    PLAN: The patient will undergo prostate seed implant.   ________________________________  Sheral Apley. Tammi Klippel, M.D.

## 2018-07-02 ENCOUNTER — Other Ambulatory Visit: Payer: Self-pay | Admitting: Urology

## 2018-07-02 ENCOUNTER — Telehealth: Payer: Self-pay | Admitting: *Deleted

## 2018-07-02 NOTE — Telephone Encounter (Signed)
CALLED PATIENT TO INFORM OF IMPLANT DATE, SPOKE WITH PATIENT AND HE IS AWARE OF THIS PROCEDURE  °

## 2018-07-04 ENCOUNTER — Ambulatory Visit (HOSPITAL_COMMUNITY)
Admission: RE | Admit: 2018-07-04 | Discharge: 2018-07-04 | Disposition: A | Discharge status: Home / Self Care | Payer: Medicare HMO | Source: Ambulatory Visit | Attending: Urology | Admitting: Urology

## 2018-07-04 ENCOUNTER — Encounter (HOSPITAL_COMMUNITY)
Admission: RE | Admit: 2018-07-04 | Discharge: 2018-07-04 | Disposition: A | Discharge status: Home / Self Care | Payer: Medicare HMO | Source: Ambulatory Visit | Attending: Urology | Admitting: Urology

## 2018-07-04 DIAGNOSIS — Z01818 Encounter for other preprocedural examination: Secondary | ICD-10-CM | POA: Insufficient documentation

## 2018-07-04 DIAGNOSIS — J984 Other disorders of lung: Secondary | ICD-10-CM | POA: Diagnosis not present

## 2018-07-07 NOTE — Progress Notes (Signed)
Chest xray results 07-04-18 faxed by epic to dr Alyson Ingles and left message with selita bradsher about chest xray results

## 2018-07-14 NOTE — Progress Notes (Signed)
Faxed chest xray results via epic on 07-04-18- and 12=23-19 and left voicemail with selita brasher

## 2018-07-23 ENCOUNTER — Encounter: Payer: Self-pay | Admitting: Family Medicine

## 2018-07-24 ENCOUNTER — Other Ambulatory Visit: Payer: Self-pay

## 2018-07-24 MED ORDER — SIMVASTATIN 20 MG PO TABS: ORAL_TABLET | ORAL | 11 refills | Status: DC

## 2018-08-04 DIAGNOSIS — H524 Presbyopia: Secondary | ICD-10-CM | POA: Diagnosis not present

## 2018-08-07 ENCOUNTER — Other Ambulatory Visit: Payer: Self-pay | Admitting: Urology

## 2018-08-07 DIAGNOSIS — C61 Malignant neoplasm of prostate: Secondary | ICD-10-CM

## 2018-08-14 ENCOUNTER — Other Ambulatory Visit: Payer: Self-pay

## 2018-08-14 ENCOUNTER — Encounter (HOSPITAL_BASED_OUTPATIENT_CLINIC_OR_DEPARTMENT_OTHER): Payer: Self-pay | Admitting: *Deleted

## 2018-08-14 NOTE — Progress Notes (Signed)
Spoke with patient for pre op interview via telephone. NPO 6 hours before, may have clear liquids until 0700. Patient verbalized of understanding of clear liquid diet and NO milk products. Patient to take Flomax and Zocor AM of surgery. Arrival time 1100. Patient to use Fleets enema AM of surgery.

## 2018-08-15 ENCOUNTER — Telehealth: Payer: Self-pay | Admitting: *Deleted

## 2018-08-15 NOTE — Telephone Encounter (Signed)
Called patient to remind of lab appt. for 08-18-18- arrival time - 10:45 am @ [facility] Admitting, spoke with patient and he is aware of this appt.

## 2018-08-18 ENCOUNTER — Encounter (HOSPITAL_COMMUNITY)
Admission: RE | Admit: 2018-08-18 | Discharge: 2018-08-18 | Disposition: A | Discharge status: Home / Self Care | Payer: Medicare HMO | Source: Ambulatory Visit | Attending: Urology | Admitting: Urology

## 2018-08-18 DIAGNOSIS — Z01812 Encounter for preprocedural laboratory examination: Secondary | ICD-10-CM | POA: Insufficient documentation

## 2018-08-18 LAB — CBC
HCT: 45.2 % (ref 39.0–52.0)
Hemoglobin: 15.3 g/dL (ref 13.0–17.0)
MCH: 31.4 pg (ref 26.0–34.0)
MCHC: 33.8 g/dL (ref 30.0–36.0)
MCV: 92.8 fL (ref 80.0–100.0)
Platelets: 192 10*3/uL (ref 150–400)
RBC: 4.87 MIL/uL (ref 4.22–5.81)
RDW: 12.5 % (ref 11.5–15.5)
WBC: 4.9 10*3/uL (ref 4.0–10.5)
nRBC: 0 % (ref 0.0–0.2)

## 2018-08-18 LAB — COMPREHENSIVE METABOLIC PANEL
ALT: 36 U/L (ref 0–44)
AST: 33 U/L (ref 15–41)
Albumin: 4.5 g/dL (ref 3.5–5.0)
Alkaline Phosphatase: 60 U/L (ref 38–126)
Anion gap: 8 (ref 5–15)
BUN: 19 mg/dL (ref 8–23)
CHLORIDE: 104 mmol/L (ref 98–111)
CO2: 29 mmol/L (ref 22–32)
Calcium: 9.5 mg/dL (ref 8.9–10.3)
Creatinine, Ser: 0.79 mg/dL (ref 0.61–1.24)
GFR calc Af Amer: >60 mL/min (ref >60)
GFR calc non Af Amer: >60 mL/min (ref >60)
Glucose, Bld: 94 mg/dL (ref 70–99)
POTASSIUM: 4.4 mmol/L (ref 3.5–5.1)
Sodium: 141 mmol/L (ref 135–145)
Total Bilirubin: 1.2 mg/dL (ref 0.3–1.2)
Total Protein: 7.3 g/dL (ref 6.5–8.1)

## 2018-08-18 LAB — PROTIME-INR
INR: 0.99
Prothrombin Time: 13 seconds (ref 11.4–15.2)

## 2018-08-18 LAB — APTT: aPTT: 31 seconds (ref 24–36)

## 2018-08-19 DIAGNOSIS — C61 Malignant neoplasm of prostate: Secondary | ICD-10-CM | POA: Diagnosis not present

## 2018-08-22 ENCOUNTER — Telehealth: Payer: Self-pay | Admitting: *Deleted

## 2018-08-22 NOTE — Telephone Encounter (Signed)
CALLED PATIENT TO REMIND OF IMPLANT FOR 08-25-18, SPOKE WITH PATIENT AND HE IS AWARE OF THIS PROCEDURE

## 2018-08-25 ENCOUNTER — Ambulatory Visit (HOSPITAL_COMMUNITY): Payer: Medicare HMO

## 2018-08-25 ENCOUNTER — Encounter (HOSPITAL_BASED_OUTPATIENT_CLINIC_OR_DEPARTMENT_OTHER)
Admission: RE | Disposition: A | Discharge status: Home / Self Care | Payer: Self-pay | Source: Home / Self Care | Attending: Urology

## 2018-08-25 ENCOUNTER — Ambulatory Visit (HOSPITAL_BASED_OUTPATIENT_CLINIC_OR_DEPARTMENT_OTHER)
Admission: RE | Admit: 2018-08-25 | Discharge: 2018-08-25 | Disposition: A | Discharge status: Home / Self Care | Payer: Medicare HMO | Attending: Urology | Admitting: Urology

## 2018-08-25 ENCOUNTER — Other Ambulatory Visit: Payer: Self-pay

## 2018-08-25 ENCOUNTER — Encounter (HOSPITAL_BASED_OUTPATIENT_CLINIC_OR_DEPARTMENT_OTHER): Payer: Self-pay | Admitting: Emergency Medicine

## 2018-08-25 ENCOUNTER — Ambulatory Visit (HOSPITAL_BASED_OUTPATIENT_CLINIC_OR_DEPARTMENT_OTHER): Payer: Medicare HMO | Admitting: Anesthesiology

## 2018-08-25 DIAGNOSIS — I10 Essential (primary) hypertension: Secondary | ICD-10-CM | POA: Diagnosis not present

## 2018-08-25 DIAGNOSIS — C61 Malignant neoplasm of prostate: Secondary | ICD-10-CM | POA: Insufficient documentation

## 2018-08-25 DIAGNOSIS — Z96653 Presence of artificial knee joint, bilateral: Secondary | ICD-10-CM | POA: Insufficient documentation

## 2018-08-25 HISTORY — PX: CYSTOSCOPY: SHX5120

## 2018-08-25 HISTORY — PX: SPACE OAR INSTILLATION: SHX6769

## 2018-08-25 HISTORY — PX: RADIOACTIVE SEED IMPLANT: SHX5150

## 2018-08-25 SURGERY — INSERTION, RADIATION SOURCE, PROSTATE
Anesthesia: General | Site: Rectum

## 2018-08-25 MED ORDER — KETOROLAC TROMETHAMINE 30 MG/ML IJ SOLN
INTRAMUSCULAR | Status: DC | PRN
  Administered 2018-08-25: 30 mg via INTRAVENOUS

## 2018-08-25 MED ORDER — FENTANYL CITRATE (PF) 100 MCG/2ML IJ SOLN
INTRAMUSCULAR | Status: DC | PRN
  Administered 2018-08-25: 50 ug via INTRAVENOUS
  Administered 2018-08-25 (×2): 25 ug via INTRAVENOUS

## 2018-08-25 MED ORDER — PROPOFOL 10 MG/ML IV BOLUS
INTRAVENOUS | Status: AC
  Filled 2018-08-25: qty 20

## 2018-08-25 MED ORDER — ONDANSETRON HCL 4 MG/2ML IJ SOLN
INTRAMUSCULAR | Status: DC | PRN
  Administered 2018-08-25: 4 mg via INTRAVENOUS

## 2018-08-25 MED ORDER — LACTATED RINGERS IV SOLN
INTRAVENOUS | Status: DC
  Administered 2018-08-25 (×2): via INTRAVENOUS
  Filled 2018-08-25: qty 1000

## 2018-08-25 MED ORDER — LIDOCAINE HCL (CARDIAC) PF 100 MG/5ML IV SOSY
PREFILLED_SYRINGE | INTRAVENOUS | Status: DC | PRN
  Administered 2018-08-25: 100 mg via INTRAVENOUS

## 2018-08-25 MED ORDER — FENTANYL CITRATE (PF) 100 MCG/2ML IJ SOLN
INTRAMUSCULAR | Status: AC
  Filled 2018-08-25: qty 2

## 2018-08-25 MED ORDER — CIPROFLOXACIN IN D5W 400 MG/200ML IV SOLN
INTRAVENOUS | Status: AC
  Filled 2018-08-25: qty 200

## 2018-08-25 MED ORDER — DEXAMETHASONE SODIUM PHOSPHATE 10 MG/ML IJ SOLN
INTRAMUSCULAR | Status: AC
  Filled 2018-08-25: qty 1

## 2018-08-25 MED ORDER — IOHEXOL 300 MG/ML  SOLN
INTRAMUSCULAR | Status: DC | PRN
  Administered 2018-08-25: 7 mL

## 2018-08-25 MED ORDER — KETOROLAC TROMETHAMINE 30 MG/ML IJ SOLN
INTRAMUSCULAR | Status: AC
  Filled 2018-08-25: qty 1

## 2018-08-25 MED ORDER — ONDANSETRON HCL 4 MG/2ML IJ SOLN
INTRAMUSCULAR | Status: AC
  Filled 2018-08-25: qty 2

## 2018-08-25 MED ORDER — PROPOFOL 10 MG/ML IV BOLUS
INTRAVENOUS | Status: DC | PRN
  Administered 2018-08-25: 200 mg via INTRAVENOUS

## 2018-08-25 MED ORDER — FLEET ENEMA 7-19 GM/118ML RE ENEM
1.0000 | ENEMA | Freq: Once | RECTAL | Status: DC
  Filled 2018-08-25: qty 1

## 2018-08-25 MED ORDER — HYDROCODONE-ACETAMINOPHEN 5-325 MG PO TABS: 1.0000 | ORAL_TABLET | Freq: Four times a day (QID) | ORAL | 0 refills | Status: DC | PRN

## 2018-08-25 MED ORDER — LIDOCAINE 2% (20 MG/ML) 5 ML SYRINGE
INTRAMUSCULAR | Status: AC
  Filled 2018-08-25: qty 5

## 2018-08-25 MED ORDER — SODIUM CHLORIDE FLUSH 0.9 % IV SOLN
INTRAVENOUS | Status: DC | PRN
  Administered 2018-08-25: 10 mL via INTRAVENOUS

## 2018-08-25 MED ORDER — MIDAZOLAM HCL 2 MG/2ML IJ SOLN
INTRAMUSCULAR | Status: AC
  Filled 2018-08-25: qty 2

## 2018-08-25 MED ORDER — DEXAMETHASONE SODIUM PHOSPHATE 4 MG/ML IJ SOLN
INTRAMUSCULAR | Status: DC | PRN
  Administered 2018-08-25: 10 mg via INTRAVENOUS

## 2018-08-25 MED ORDER — CIPROFLOXACIN IN D5W 400 MG/200ML IV SOLN
400.0000 mg | INTRAVENOUS | Status: AC
  Administered 2018-08-25: 400 mg via INTRAVENOUS
  Filled 2018-08-25: qty 200

## 2018-08-25 MED ORDER — MIDAZOLAM HCL 5 MG/5ML IJ SOLN
INTRAMUSCULAR | Status: DC | PRN
  Administered 2018-08-25: 2 mg via INTRAVENOUS

## 2018-08-25 MED ORDER — FENTANYL CITRATE (PF) 100 MCG/2ML IJ SOLN
25.0000 ug | INTRAMUSCULAR | Status: DC | PRN
  Administered 2018-08-25 (×2): 25 ug via INTRAVENOUS
  Administered 2018-08-25: 50 ug via INTRAVENOUS
  Filled 2018-08-25: qty 1

## 2018-08-25 MED ORDER — SODIUM CHLORIDE 0.9 % IR SOLN
Status: DC | PRN
  Administered 2018-08-25: 1000 mL

## 2018-08-25 SURGICAL SUPPLY — 46 items
BAG URINE DRAINAGE (UROLOGICAL SUPPLIES) ×4 IMPLANT
BLADE CLIPPER SURG (BLADE) ×4 IMPLANT
CATH FOLEY 2WAY SLVR  5CC 16FR (CATHETERS) ×2
CATH FOLEY 2WAY SLVR 5CC 16FR (CATHETERS) ×6 IMPLANT
CATH ROBINSON RED A/P 20FR (CATHETERS) ×4 IMPLANT
CLOTH BEACON ORANGE TIMEOUT ST (SAFETY) ×4 IMPLANT
CONT SPECI 4OZ STER CLIK (MISCELLANEOUS) ×8 IMPLANT
COVER BACK TABLE 60X90IN (DRAPES) ×4 IMPLANT
COVER MAYO STAND STRL (DRAPES) ×4 IMPLANT
COVER WAND RF STERILE (DRAPES) ×4 IMPLANT
DRSG TEGADERM 4X4.75 (GAUZE/BANDAGES/DRESSINGS) ×8 IMPLANT
DRSG TEGADERM 8X12 (GAUZE/BANDAGES/DRESSINGS) ×8 IMPLANT
GLOVE BIO SURGEON STRL SZ 6 (GLOVE) IMPLANT
GLOVE BIO SURGEON STRL SZ 6.5 (GLOVE) IMPLANT
GLOVE BIO SURGEON STRL SZ7 (GLOVE) IMPLANT
GLOVE BIO SURGEON STRL SZ8 (GLOVE) ×4 IMPLANT
GLOVE BIOGEL PI IND STRL 6 (GLOVE) IMPLANT
GLOVE BIOGEL PI IND STRL 6.5 (GLOVE) IMPLANT
GLOVE BIOGEL PI IND STRL 7.0 (GLOVE) IMPLANT
GLOVE BIOGEL PI IND STRL 8 (GLOVE) IMPLANT
GLOVE BIOGEL PI IND STRL 8.5 (GLOVE) IMPLANT
GLOVE BIOGEL PI INDICATOR 6 (GLOVE)
GLOVE BIOGEL PI INDICATOR 6.5 (GLOVE)
GLOVE BIOGEL PI INDICATOR 7.0 (GLOVE)
GLOVE BIOGEL PI INDICATOR 8 (GLOVE)
GLOVE BIOGEL PI INDICATOR 8.5 (GLOVE) ×1
GLOVE ECLIPSE 8.0 STRL XLNG CF (GLOVE) ×4 IMPLANT
GLOVE INDICATOR 8.5 STRL (GLOVE) ×1 IMPLANT
GOWN STRL REUS W/TWL XL LVL3 (GOWN DISPOSABLE) ×4 IMPLANT
HOLDER FOLEY CATH W/STRAP (MISCELLANEOUS) ×4 IMPLANT
IMPL SPACEOAR SYSTEM 10ML (Spacer) IMPLANT
IMPLANT SPACEOAR SYSTEM 10ML (Spacer) ×4 IMPLANT
IV NS 1000ML (IV SOLUTION) ×4
IV NS 1000ML BAXH (IV SOLUTION) ×3 IMPLANT
KIT TURNOVER CYSTO (KITS) ×4 IMPLANT
MANIFOLD NEPTUNE II (INSTRUMENTS) IMPLANT
MARKER SKIN DUAL TIP RULER LAB (MISCELLANEOUS) ×4 IMPLANT
PACK CYSTO (CUSTOM PROCEDURE TRAY) ×4 IMPLANT
SURGILUBE 2OZ TUBE FLIPTOP (MISCELLANEOUS) ×4 IMPLANT
SUT BONE WAX W31G (SUTURE) IMPLANT
SYR 10ML LL (SYRINGE) ×8 IMPLANT
TOWEL OR 17X24 6PK STRL BLUE (TOWEL DISPOSABLE) ×8 IMPLANT
UNDERPAD 30X30 (UNDERPADS AND DIAPERS) ×8 IMPLANT
WATER STERILE IRR 3000ML UROMA (IV SOLUTION) ×4 IMPLANT
WATER STERILE IRR 500ML POUR (IV SOLUTION) ×4 IMPLANT
i-seed agx100 ×1 IMPLANT

## 2018-08-25 NOTE — Transfer of Care (Signed)
Immediate Anesthesia Transfer of Care Note  Patient: YOUSEF HUGE  Procedure(s) Performed: RADIOACTIVE SEED IMPLANT/BRACHYTHERAPY IMPLANT (N/A Prostate) SPACE OAR INSTILLATION (N/A Rectum) CYSTOSCOPY FLEXIBLE (N/A Bladder)  Patient Location: PACU  Anesthesia Type:General  Level of Consciousness: awake, alert , oriented and patient cooperative  Airway & Oxygen Therapy: Patient Spontanous Breathing and Patient connected to nasal cannula oxygen  Post-op Assessment: Report given to RN and Post -op Vital signs reviewed and stable  Post vital signs: Reviewed and stable  Last Vitals:  Vitals Value Taken Time  BP    Temp    Pulse    Resp    SpO2      Last Pain:  Vitals:   08/25/18 1049  TempSrc: Oral         Complications: No apparent anesthesia complications

## 2018-08-25 NOTE — Op Note (Signed)
PRE-OPERATIVE DIAGNOSIS:  Adenocarcinoma of the prostate  POST-OPERATIVE DIAGNOSIS:  Same  PROCEDURE:  Procedure(s): 1. I-125 radioactive seed implantation 2. Cystoscopy 3. SpaceOAR placement  SURGEON:  Surgeon(s): Nicolette Bang, MD  Radiation oncologist: Tyler Pita, MD  ANESTHESIA:  General  EBL:  Minimal  DRAINS: 49 French Foley catheter  INDICATION: Jeffrey Romero is a 68 year old with a history of T1c prostate cancer. After discussing treatment options he has elected to proceed with brachytherapy  Description of procedure: After informed consent the patient was brought to the major OR, placed on the table and administered general anesthesia. He was then moved to the modified lithotomy position with his perineum perpendicular to the floor. His perineum and genitalia were then sterilely prepped. An official timeout was then performed. A 16 French Foley catheter was then placed in the bladder and filled with dilute contrast, a rectal tube was placed in the rectum and the transrectal ultrasound probe was placed in the rectum and affixed to the stand. He was then sterilely draped.  Real time ultrasonography was used along with the seed planning software [employer] Prostate vs. 4.2.21. This was used to develop the seed plan including the number of needles as well as number of seeds required for complete and adequate coverage. Real-time ultrasonography was then used along with the previously developed plan and the Nucletron device to implant a total of 77 seeds using 22 needles. This proceeded without difficulty or complication.  We then proceeded to mix the SpaceOAR using the kit supplied from the manufacturer. Once this was complete we placed a sinal needle into the perirectal fat between the rectum and the prostate. Once this was accomplished we injected 2cc of normal saline to hydrodissect the plain. We then instilled the the SpaceOAR through the spinal needle and noted good  distribution in the perirectal fat.   A Foley catheter was then removed as well as the transrectal ultrasound probe and rectal probe. Flexible cystoscopy was then performed using the 17 French flexible scope which revealed a normal urethra throughout its length down to the sphincter which appeared intact. The prostatic urethra revealed bilobar hypertrophy but no evidence of obstruction, seeds, spacers or lesions. The bladder was then entered and fully and systematically inspected. The ureteral orifices were noted to be of normal configuration and position. The mucosa revealed no evidence of tumors. There were also no stones identified within the bladder. I noted no seeds or spacers on the floor of the bladder and retroflexion of the scope revealed no seeds protruding from the base of the prostate.  The cystoscope was then removed and a new 59 French Foley catheter was then inserted and the balloon was filled with 10 cc of sterile water. This was connected to closed system drainage and the patient was awakened and taken to recovery room in stable and satisfactory condition. He tolerated procedure well and there were no intraoperative complications.

## 2018-08-25 NOTE — Anesthesia Procedure Notes (Signed)
Procedure Name: LMA Insertion Date/Time: 08/25/2018 1:35 PM Performed by: Justice Rocher, CRNA Pre-anesthesia Checklist: Patient identified, Emergency Drugs available, Suction available and Patient being monitored Patient Re-evaluated:Patient Re-evaluated prior to induction Oxygen Delivery Method: Circle system utilized Preoxygenation: Pre-oxygenation with 100% oxygen Induction Type: IV induction Ventilation: Mask ventilation without difficulty LMA: LMA inserted LMA Size: 5.0 Number of attempts: 1 Airway Equipment and Method: Bite block Placement Confirmation: positive ETCO2 and breath sounds checked- equal and bilateral Tube secured with: Tape Dental Injury: Teeth and Oropharynx as per pre-operative assessment

## 2018-08-25 NOTE — Discharge Instructions (Signed)
Indwelling Urinary Catheter Care, Adult An indwelling urinary catheter is a thin tube that is put into your bladder. The tube helps to drain pee (urine) out of your body. The tube goes in through your urethra. Your urethra is where pee comes out of your body. Your pee will come out through the catheter, then it will go into a bag (drainage bag). Take good care of your catheter so it will work well. How to wear your catheter and bag Supplies needed  Sticky tape (adhesive tape) or a leg strap.  Alcohol wipe or soap and water (if you use tape).  A clean towel (if you use tape).  Large overnight bag.  Smaller bag (leg bag). Wearing your catheter Attach your catheter to your leg with tape or a leg strap.  Make sure the catheter is not pulled tight.  If a leg strap gets wet, take it off and put on a dry strap.  If you use tape to hold the bag on your leg: 1. Use an alcohol wipe or soap and water to wash your skin where the tape made it sticky before. 2. Use a clean towel to pat-dry that skin. 3. Use new tape to make the bag stay on your leg. Wearing your bags You should have been given a large overnight bag.  You may wear the overnight bag in the day or night.  Always have the overnight bag lower than your bladder.  Do not let the bag touch the floor.  Before you go to sleep, put a clean plastic bag in a wastebasket. Then hang the overnight bag inside the wastebasket. You should also have a smaller leg bag that fits under your clothes.  Always wear the leg bag below your knee.  Do not wear your leg bag at night. How to care for your skin and catheter Supplies needed  A clean washcloth.  Water and mild soap.  A clean towel. Caring for your skin and catheter      Clean the skin around your catheter every day: ? Wash your hands with soap and water. ? Wet a clean washcloth in warm water and mild soap. ? Clean the skin around your urethra. ? If you are male: ? Gently  spread the folds of skin around your vagina (labia). ? With the washcloth in your other hand, wipe the inner side of your labia on each side. Wipe from front to back. ? If you are male: ? Pull back any skin that covers the end of your penis (foreskin). ? With the washcloth in your other hand, wipe your penis in small circles. Start wiping at the tip of your penis, then move away from the catheter. ? With your free hand, hold the catheter close to where it goes into your body. ? Keep holding the catheter during cleaning so it does not get pulled out. ? With the washcloth in your other hand, clean the catheter. ? Only wipe downward on the catheter. ? Do not wipe upward toward your body. Doing this may push germs into your urethra and cause infection. ? Use a clean towel to pat-dry the catheter and the skin around it. Make sure to wipe off all soap. ? Wash your hands with soap and water.  Shower every day. Do not take baths.  Do not use cream, ointment, or lotion on the area where the catheter goes into your body, unless your doctor tells you to.  Do not use powders, sprays, or lotions  on your genital area.  Check your skin around the catheter every day for signs of infection. Check for: ? Redness, swelling, or pain. ? Fluid or blood. ? Warmth. ? Pus or a bad smell. How to empty the bag Supplies needed  Rubbing alcohol.  Gauze pad or cotton ball.  Tape or a leg strap. Emptying the bag Pour the pee out of your bag when it is ?- full, or at least 2-3 times a day. Do this for your overnight bag and your leg bag. 1. Wash your hands with soap and water. 2. Separate (detach) the bag from your leg. 3. Hold the bag over the toilet or a clean pail. Keep the bag lower than your hips and bladder. This is so the pee (urine) does not go back into the tube. 4. Open the pour spout. It is at the bottom of the bag. 5. Empty the pee into the toilet or pail. Do not let the pour spout touch any  surface. 6. Put rubbing alcohol on a gauze pad or cotton ball. 7. Use the gauze pad or cotton ball to clean the pour spout. 8. Close the pour spout. 9. Attach the bag to your leg with tape or a leg strap. 10. Wash your hands with soap and water. Follow instructions for cleaning the drainage bag:  From the product maker.  As told by your doctor. How to change the bag Supplies needed  Alcohol wipes.  A clean bag.  Tape or a leg strap. Changing the bag Replace your bag with a clean bag once a month. If it starts to leak, smell bad, or look dirty, change it sooner. 1. Wash your hands with soap and water. 2. Separate the dirty bag from your leg. 3. Pinch the catheter with your fingers so that pee does not spill out. 4. Separate the catheter tube from the bag tube where these tubes connect (at the connection valve). Do not let the tubes touch any surface. 5. Clean the end of the catheter tube with an alcohol wipe. Use a different alcohol wipe to clean the end of the bag tube. 6. Connect the catheter tube to the tube of the clean bag. 7. Attach the clean bag to your leg with tape or a leg strap. Do not make the bag tight on your leg. 8. Wash your hands with soap and water. General rules   Never pull on your catheter. Never try to take it out. Doing that can hurt you.  Always wash your hands before and after you touch your catheter or bag. Use a mild, fragrance-free soap. If you do not have soap and water, use hand sanitizer.  Always make sure there are no twists or bends (kinks) in the catheter tube.  Always make sure there are no leaks in the catheter or bag.  Drink enough fluid to keep your pee pale yellow.  Do not take baths, swim, or use a hot tub.  If you are male, wipe from front to back after you poop (have a bowel movement). Contact a doctor if:  Your pee is cloudy.  Your pee smells worse than usual.  Your catheter gets clogged.  Your catheter leaks.  Your  bladder feels full. Get help right away if:  You have redness, swelling, or pain where the catheter goes into your body.  You have fluid, blood, pus, or a bad smell coming from the area where the catheter goes into your body.  Your skin feels warm  where the catheter goes into your body.  You have a fever.  You have pain in your: ? Belly (abdomen). ? Legs. ? Lower back. ? Bladder.  You see blood in the catheter.  Your pee is pink or red.  You feel sick to your stomach (nauseous).  You throw up (vomit).  You have chills.  Your pee is not draining into the bag.  Your catheter gets pulled out. Summary  An indwelling urinary catheter is a thin tube that is placed into the bladder to help drain pee (urine) out of the body.  The catheter is placed into the part of the body that drains pee from the bladder (urethra).  Taking good care of your catheter will keep it working properly and help prevent problems.  Always wash your hands before and after touching your catheter or bag.  Never pull on your catheter or try to take it out. This information is not intended to replace advice given to you by your health care provider. Make sure you discuss any questions you have with your health care provider. Document Released: 11/03/2012 Document Revised: 12/30/2017 Document Reviewed: 02/22/2017 Elsevier Interactive Patient Education  2019 [employer] Instructions   Activity:    Rest for the remainder of the day.  Do not drive or operate equipment today.  You may resume normal  activities in a few days as instructed by your physician, without risk of harmful radiation exposure to those around you, provided you follow the time and distance precautions on the Radiation Oncology Instruction Sheet.   Meals: Drink plenty of lipuids and eat light foods, such as gelatin or soup this evening .  You may return to normal meal plan tomorrow.  Return To  Work: You may return to work as instructed by [employer].  Special Instruction:   If any seeds are found, use tweezers to pick up seeds and place in a glass container of any kind and bring to your physician's office.  Call your physician if any of these symptoms occur:   Persistent or heavy bleeding  Urine stream diminishes or stops completely after catheter is removed  Fever equal to or greater than 101 degrees F  Cloudy urine with a strong foul odor  Severe pain  You may feel some burning pain and/or hesitancy when you urinate after the catheter is removed.  These symptoms may increase over the next few weeks, but should diminish within forur to six weeks.  Applying moist heat to the lower abdomen or a hot tub bath may help relieve the pain.  If the discomfort becomes severe, please call your physician for additional medications.   Post Anesthesia Home Care Instructions  Activity: Get plenty of rest for the remainder of the day. A responsible individual must stay with you for 24 hours following the procedure.  For the next 24 hours, DO NOT: -Drive a car -Paediatric nurse -Drink alcoholic beverages -Take any medication unless instructed by your physician -Make any legal decisions or sign important papers.  Meals: Start with liquid foods such as gelatin or soup. Progress to regular foods as tolerated. Avoid greasy, spicy, heavy foods. If nausea and/or vomiting occur, drink only clear liquids until the nausea and/or vomiting subsides. Call your physician if vomiting continues.  Special Instructions/Symptoms: Your throat may feel dry or sore from the anesthesia or the breathing tube placed in your throat during surgery. If this causes discomfort, gargle with warm salt water.  The discomfort should disappear within 24 hours.

## 2018-08-25 NOTE — H&P (Signed)
Urology Admission H&P  Chief Complaint: prostate cancer  History of Present Illness: Mr Limbach is a 68yo with a hx of T1c prostate cancer here for brachytherapy with SpaceOAR. Mild LUTs. Mild ED. No hematuria or dysuria. No fevers/chills/sweats  Past Medical History:  Diagnosis Date  . BPH (benign prostatic hyperplasia) 03/01/2016  . EXTERNAL HEMORRHOIDS WITHOUT MENTION COMP   . HYPERLIPIDEMIA   . HYPERTENSION   . Osteoarthritis of knee   . Prostate cancer [facility])    Past Surgical History:  Procedure Laterality Date  . Neck fusion  05 & 07  . PROSTATE BIOPSY    . right knee surgery  2006  . TONSILLECTOMY AND ADENOIDECTOMY    . TOTAL KNEE ARTHROPLASTY  08/2010   bilateral - olin    Home Medications:  Current Facility-Administered Medications  Medication Dose Route Frequency Provider Last Rate Last Dose  . ciprofloxacin (CIPRO) IVPB 400 mg  400 mg Intravenous 60 min Pre-Op Alyson Ingles Candee Furbish, MD      . lactated ringers infusion   Intravenous Continuous Myrtie Soman, MD 50 mL/hr at 08/25/18 1115    . [START ON 08/26/2018] sodium phosphate (FLEET) 7-19 GM/118ML enema 1 enema  1 enema Rectal Once McKenzie, Candee Furbish, MD       Allergies: No Known Allergies  Family History  Adopted: Yes  Family history unknown: Yes   Social History:  reports that he has never smoked. He has never used smokeless tobacco. He reports current alcohol use. He reports that he does not use drugs.  Review of Systems  All other systems reviewed and are negative.   Physical Exam:  Vital signs in last 24 hours: Temp:  [97.3 F (36.3 C)] 97.3 F (36.3 C) (02/03 1049) Pulse Rate:  [76] 76 (02/03 1049) Resp:  [16] 16 (02/03 1049) BP: (143)/(89) 143/89 (02/03 1049) SpO2:  [100 %] 100 % (02/03 1049) Weight:  [95.1 kg] 95.1 kg (02/03 1049) Physical Exam  Constitutional: He is oriented to person, place, and time. He appears well-developed and well-nourished.  HENT:  Head: Normocephalic and atraumatic.   Eyes: Pupils are equal, round, and reactive to light. EOM are normal.  Neck: Normal range of motion.  Cardiovascular: Normal rate and regular rhythm.  Respiratory: Effort normal. No respiratory distress.  GI: Soft. He exhibits no distension.  Musculoskeletal: Normal range of motion.        General: No edema.  Neurological: He is alert and oriented to person, place, and time.  Skin: Skin is warm and dry.  Psychiatric: He has a normal mood and affect. His behavior is normal. Judgment and thought content normal.    Laboratory Data:  No results found for this or any previous visit (from the past 24 hour(s)). No results found for this or any previous visit (from the past 240 hour(s)). Creatinine: No results for input(s): CREATININE in the last 168 hours. Baseline Creatinine: unknown  Impression/Assessment:  67yo with prostate cancer  Plan:  The risks/benefits/alternatives to brachytherapy with SpaceOAR was explained to the patient and he understands and wishes to proceed with surgery  Nicolette Bang 08/25/2018, 12:17 PM

## 2018-08-25 NOTE — Anesthesia Postprocedure Evaluation (Signed)
Anesthesia Post Note  Patient: Jeffrey Romero  Procedure(s) Performed: RADIOACTIVE SEED IMPLANT/BRACHYTHERAPY IMPLANT (N/A Prostate) SPACE OAR INSTILLATION (N/A Rectum) CYSTOSCOPY FLEXIBLE (N/A Bladder)     Patient location during evaluation: PACU Anesthesia Type: General Level of consciousness: awake and alert and oriented Pain management: pain level controlled Vital Signs Assessment: post-procedure vital signs reviewed and stable Respiratory status: spontaneous breathing, nonlabored ventilation and respiratory function stable Cardiovascular status: stable and blood pressure returned to baseline Postop Assessment: no apparent nausea or vomiting Anesthetic complications: no    Last Vitals:  Vitals:   08/25/18 1530 08/25/18 1545  BP: 129/84 137/86  Pulse: 66 67  Resp: 10 11  Temp:    SpO2: 97% 96%    Last Pain:  Vitals:   08/25/18 1545  TempSrc:   PainSc: 2                  Lylith Bebeau A.

## 2018-08-25 NOTE — Progress Notes (Signed)
  Radiation Oncology         [phone] ________________________________  Name: Jeffrey Romero MRN: [id number]  Date: 08/25/2018  DOB: May 06, 1951       Prostate Seed Implant  [id number], Angela Adam, MD  No ref. provider found  DIAGNOSIS: 68 y.o. gentleman with Stage T1c adenocarcinoma of the prostate with Gleason score of 3+3, and PSA of 4.58.  PROCEDURE: Insertion of radioactive I-125 seeds into the prostate gland.  RADIATION DOSE: 145 Gy, definitive therapy.  TECHNIQUE: Jeffrey Romero was brought to the operating room with the urologist. He was placed in the dorsolithotomy position. He was catheterized and a rectal tube was inserted. The perineum was shaved, prepped and draped. The ultrasound probe was then introduced into the rectum to see the prostate gland.  TREATMENT DEVICE: A needle grid was attached to the ultrasound probe stand and anchor needles were placed.  3D PLANNING: The prostate was imaged in 3D using a sagittal sweep of the prostate probe. These images were transferred to the planning computer. There, the prostate, urethra and rectum were defined on each axial reconstructed image. Then, the software created an optimized 3D plan and a few seed positions were adjusted. The quality of the plan was reviewed using [facility] information for the target and the following two organs at risk:  Urethra and Rectum.  Then the accepted plan was printed and handed off to the radiation therapist.  Under my supervision, the custom loading of the seeds and spacers was carried out and loaded into sealed vicryl sleeves.  These pre-loaded needles were then placed into the needle holder.Marland Kitchen  PROSTATE VOLUME STUDY:  Using transrectal ultrasound the volume of the prostate was verified to be 61.1 cc.  SPECIAL TREATMENT PROCEDURE/SUPERVISION AND HANDLING: The pre-loaded needles were then delivered under sagittal guidance. A total of 22 needles were used to deposit 77 seeds in the prostate gland. The  individual seed activity was 0.596 mCi.  SpaceOAR:  Yes  COMPLEX SIMULATION: At the end of the procedure, an anterior radiograph of the pelvis was obtained to document seed positioning and count. Cystoscopy was performed to check the urethra and bladder.  MICRODOSIMETRY: At the end of the procedure, the patient was emitting 0.07 mR/hr at 1 meter. Accordingly, he was considered safe for hospital discharge.  PLAN: The patient will return to the radiation oncology clinic for post implant CT dosimetry in three weeks.   ________________________________  Sheral Apley Tammi Klippel, M.D.

## 2018-08-25 NOTE — Progress Notes (Signed)
Patient informed phase 2 nurse he did not have anyone to be with him for the next 24 hours.  Dr.Foster anesthesia doctor informed.  It was decided that he would be given the opportunity to make arrangement before time for him to be released from the center.  His friend Abigail Miyamoto offered to allow him to stay in her guess room. He agreed to stay with his neighbor.  His neighbor Franki Cabot phone [phone] stated over the phone that it was fine for him to stay with her and her husband Timmothy Sours.  Patient driven to neighbor by his friend Abigail Miyamoto [phone].

## 2018-08-25 NOTE — Anesthesia Preprocedure Evaluation (Signed)
Anesthesia Evaluation  Patient identified by MRN, date of birth, ID band Patient awake    Reviewed: Allergy & Precautions, NPO status , Patient's Chart, lab work & pertinent test results  Airway Mallampati: II  TM Distance: >3 FB     Dental   Pulmonary neg pulmonary ROS,    breath sounds clear to auscultation       Cardiovascular hypertension,  Rhythm:Regular Rate:Normal     Neuro/Psych    GI/Hepatic negative GI ROS, Neg liver ROS,   Endo/Other  negative endocrine ROS  Renal/GU negative Renal ROS     Musculoskeletal  (+) Arthritis ,   Abdominal   Peds  Hematology   Anesthesia Other Findings   Reproductive/Obstetrics                             Anesthesia Physical Anesthesia Plan  ASA: III  Anesthesia Plan: General   Post-op Pain Management:    Induction: Intravenous  PONV Risk Score and Plan: Ondansetron, Dexamethasone and Midazolam  Airway Management Planned: LMA  Additional Equipment:   Intra-op Plan:   Post-operative Plan: Extubation in OR  Informed Consent:   Plan Discussed with: CRNA and Anesthesiologist  Anesthesia Plan Comments:         Anesthesia Quick Evaluation

## 2018-08-26 ENCOUNTER — Encounter (HOSPITAL_BASED_OUTPATIENT_CLINIC_OR_DEPARTMENT_OTHER): Payer: Self-pay | Admitting: Urology

## 2018-09-02 ENCOUNTER — Telehealth: Payer: Self-pay | Admitting: *Deleted

## 2018-09-02 NOTE — Telephone Encounter (Signed)
CALLED PATIENT TO REMIND OF MRI FOR 09-03-18 - ARRIVAL TIME - 3:30 PM @ [facility], NO RESTRICTIONS TO TEST, PATIENT AGREED TO TEST DAY AND TIME

## 2018-09-03 ENCOUNTER — Ambulatory Visit (HOSPITAL_COMMUNITY): Payer: Medicare HMO

## 2018-09-04 ENCOUNTER — Ambulatory Visit
Admission: RE | Admit: 2018-09-04 | Discharge: 2018-09-04 | Disposition: A | Discharge status: Home / Self Care | Payer: Medicare HMO | Source: Ambulatory Visit | Attending: Radiation Oncology | Admitting: Radiation Oncology

## 2018-09-04 ENCOUNTER — Ambulatory Visit
Admission: RE | Admit: 2018-09-04 | Discharge: 2018-09-04 | Disposition: A | Discharge status: Home / Self Care | Payer: Medicare HMO | Source: Ambulatory Visit | Attending: Urology | Admitting: Urology

## 2018-09-04 ENCOUNTER — Encounter: Payer: Self-pay | Admitting: Medical Oncology

## 2018-09-04 ENCOUNTER — Other Ambulatory Visit: Payer: Self-pay

## 2018-09-04 ENCOUNTER — Encounter: Payer: Self-pay | Admitting: Urology

## 2018-09-04 DIAGNOSIS — I10 Essential (primary) hypertension: Secondary | ICD-10-CM | POA: Diagnosis not present

## 2018-09-04 DIAGNOSIS — C61 Malignant neoplasm of prostate: Secondary | ICD-10-CM | POA: Insufficient documentation

## 2018-09-04 DIAGNOSIS — Z79899 Other long term (current) drug therapy: Secondary | ICD-10-CM | POA: Diagnosis not present

## 2018-09-04 DIAGNOSIS — Z923 Personal history of irradiation: Secondary | ICD-10-CM | POA: Insufficient documentation

## 2018-09-04 NOTE — Progress Notes (Signed)
Patient returns today a little more than a week out from seed procedure for follow up. Post seed IPSS 21. Reports urinary urgency is the most problematic symptom he has now. Reports dysuria and bladder spasms resolved three days ago. Denies hematuria. Denies urinary leakage or continence. Reports taking a stool softener to avoid constipation. Denies pain. Weight and vitals stable. Scheduled for follow up at [facility] on 09/15/18. Awaiting prior authorization for MRI to confirm SpaceOar placement.   BP 126/86 (BP Location: Right Arm, Patient Position: Sitting)   Pulse 72   Temp (!) 97.4 F (36.3 C) (Oral)   Resp 20   Ht 5\' 9"  (1.753 m)   Wt 207 lb 12.8 oz (94.3 kg)   SpO2 100%   BMI 30.69 kg/m  Wt Readings from Last 3 Encounters:  09/04/18 207 lb 12.8 oz (94.3 kg)  08/25/18 209 lb 9.6 oz (95.1 kg)  06/27/18 213 lb (96.6 kg)

## 2018-09-04 NOTE — Progress Notes (Signed)
Radiation Oncology         [phone] ________________________________  Name: CLAYTEN ALLCOCK MRN: [id number]  Date: 09/04/2018  DOB: Sep 20, 1950  Post-Seed Follow-Up Visit Note  CC: [name], MD  [name], MD  Diagnosis:   68 y.o. gentleman with Stage T1c adenocarcinoma of the prostate with Gleason score of 3+3, and PSA of 4.58.    ICD-10-CM   1. Malignant neoplasm of prostate (HCC) C61     Interval Since Last Radiation:  1.5 weeks 08/25/18:  Insertion of radioactive I-125 seeds into the prostate gland; 145 Gy, definitive therapy with placement of SpaceOAR gel.  Narrative:  The patient returns today for routine follow-up.  He is complaining of increased urinary frequency and urinary hesitation symptoms. He filled out a questionnaire regarding urinary function today providing and overall IPSS score of 21 characterizing his symptoms as moderate-severe.  His pre-implant score was 5.  He specifically denies dysuria, gross hematuria, suprapubic pain, fever, chills or night sweats.  He continues with a weak flow of stream, urgency, increased daytime frequency, intermittency and nocturia x3 per night.  He has noticed gradual improvement in his LUTS over the past 3 days.  He continues taking Flomax daily and has been using a stool softener to help with some mild constipation.  He currently denies any bowel symptoms and specifically denies abdominal pain, nausea, vomiting or diarrhea.  He reports a healthy appetite and is maintaining his weight.  Overall, he is quite pleased with his progress to date.  ALLERGIES:  has No Known Allergies.  Meds: Current Outpatient Medications  Medication Sig Dispense Refill  . hydrochlorothiazide (MICROZIDE) 12.5 MG capsule TAKE 1 CAPSULE BY MOUTH EVERY DAY 90 capsule 1  . ibuprofen (ADVIL,MOTRIN) 200 MG tablet Take 200 mg by mouth as needed.     . Omega-3 Fatty Acids (FISH OIL) 1200 MG CAPS Take 1,200 mg by mouth.    . simvastatin (ZOCOR)  20 MG tablet simvastatin 20 mg tablet 30 tablet 11  . tamsulosin (FLOMAX) 0.4 MG CAPS capsule TAKE 1 CAPSULE BY MOUTH EVERY DAY 90 capsule 1  . HYDROcodone-acetaminophen (NORCO) 5-325 MG tablet Take 1 tablet by mouth every 6 (six) hours as needed for moderate pain. (Patient not taking: Reported on 09/04/2018) 30 tablet 0   No current facility-administered medications for this encounter.     Physical Findings: In general this is a well appearing Caucasian male in no acute distress.  He's alert and oriented x4 and appropriate throughout the examination. Cardiopulmonary assessment is negative for acute distress and he exhibits normal effort.   Lab Findings: Lab Results  Component Value Date   WBC 4.9 08/18/2018   HGB 15.3 08/18/2018   HCT 45.2 08/18/2018   MCV 92.8 08/18/2018   PLT 192 08/18/2018    Radiographic Findings:  Patient underwent CT imaging in our clinic for post implant dosimetry. The CT will be reviewed by Dr. Tammi Klippel to ensure an adequate distribution of radioactive seeds throughout the prostate gland and confirm that there are no seeds in or near the rectum.  We are awaiting insurance prior approval to schedule his MRI of the prostate for further evaluation of the spaceOAR gel distribution.  Once obtained, these images will be fused with his CT images for further evaluation.  We suspect the final radiation plan and dosimetry will show appropriate coverage of the prostate gland.  He understands it we will contact him if there are any unanticipated findings on further evaluation.  Impression/Plan: 68 y.o. gentleman with Stage T1c adenocarcinoma of the prostate with Gleason score of 3+3, and PSA of 4.58. The patient is recovering from the effects of radiation. His urinary symptoms should gradually improve over the next 4-6 months. We talked about this today. He is encouraged by his improvement already and is otherwise pleased with his outcome. We also talked about long-term follow-up  for prostate cancer following seed implant. He understands that ongoing PSA determinations and digital rectal exams will help perform surveillance to rule out disease recurrence. He has a follow up appointment scheduled with Jiles Crocker, NP on 09/15/2018 and anticipates a follow-up visit with reassessment of the PSA with Dr. Alyson Ingles sometime in late April or early May. He understands what to expect with his PSA measures. Patient was also educated today about some of the long-term effects from radiation including a small risk for rectal bleeding and possibly erectile dysfunction. We talked about some of the general management approaches to these potential complications. However, I did encourage the patient to contact our office or return at any point if he has questions or concerns related to his previous radiation and prostate cancer.    Nicholos Johns, PA-C

## 2018-09-05 NOTE — Progress Notes (Signed)
  Radiation Oncology         [phone] ________________________________  Name: Jeffrey Romero MRN: [id number]  Date: 09/04/2018  DOB: [birth year]  COMPLEX SIMULATION NOTE  NARRATIVE:  The patient was brought to the [facility] today following prostate seed implantation approximately one month ago.  Identity was confirmed.  All relevant records and images related to the planned course of therapy were reviewed.  Then, the patient was set-up supine.  CT images were obtained.  The CT images were loaded into the planning software.  Then the prostate and rectum were contoured.  Treatment planning then occurred.  The implanted iodine 125 seeds were identified by the physics staff for projection of radiation distribution  I have requested : 3D Simulation  I have requested a DVH of the following structures: Prostate and rectum.    ________________________________  Sheral Apley Tammi Klippel, M.D.

## 2018-09-11 ENCOUNTER — Encounter: Payer: Self-pay | Admitting: Radiation Oncology

## 2018-09-11 DIAGNOSIS — I10 Essential (primary) hypertension: Secondary | ICD-10-CM | POA: Diagnosis not present

## 2018-09-11 DIAGNOSIS — Z79899 Other long term (current) drug therapy: Secondary | ICD-10-CM | POA: Diagnosis not present

## 2018-09-11 DIAGNOSIS — C61 Malignant neoplasm of prostate: Secondary | ICD-10-CM | POA: Diagnosis not present

## 2018-09-12 ENCOUNTER — Telehealth: Payer: Self-pay | Admitting: *Deleted

## 2018-09-12 NOTE — Telephone Encounter (Signed)
CALLED PATIENT TO INFORM OF MRI FOR 09/16/18 - ARRIVAL TIME- 4:30 PM @ [facility], NO RESTRICTIONS TO TEST, SPOKE WITH PATIENT AND HE IS AWARE OF THIS TEST

## 2018-09-15 DIAGNOSIS — C61 Malignant neoplasm of prostate: Secondary | ICD-10-CM | POA: Diagnosis not present

## 2018-09-16 ENCOUNTER — Ambulatory Visit (HOSPITAL_COMMUNITY)
Admission: RE | Admit: 2018-09-16 | Discharge: 2018-09-16 | Disposition: A | Discharge status: Home / Self Care | Payer: Medicare HMO | Source: Ambulatory Visit | Attending: Urology | Admitting: Urology

## 2018-09-16 DIAGNOSIS — C61 Malignant neoplasm of prostate: Secondary | ICD-10-CM | POA: Insufficient documentation

## 2018-09-16 NOTE — Progress Notes (Signed)
  Radiation Oncology         [phone] ________________________________  Name: Jeffrey Romero MRN: [id number]  Date: 09/11/2018  DOB: 04-19-1951  3D Planning Note   Prostate Brachytherapy Post-Implant Dosimetry  Diagnosis: 68 y.o. gentleman with Stage T1c adenocarcinoma of the prostate with Gleason score of 3+3, and PSA of 4.58.   Narrative: On a previous date, Jeffrey Romero returned following prostate seed implantation for post implant planning. He underwent CT scan complex simulation to delineate the three-dimensional structures of the pelvis and demonstrate the radiation distribution.  Since that time, the seed localization, and complex isodose planning with dose volume histograms have now been completed.  Results:   Prostate Coverage - The dose of radiation delivered to the 90% or more of the prostate gland (D90) was 108.99% of the prescription dose. This exceeds our goal of greater than 90%. Rectal Sparing - The volume of rectal tissue receiving the prescription dose or higher was 0.0 cc. This falls under our thresholds tolerance of 1.0 cc.  Impression: The prostate seed implant appears to show adequate target coverage and appropriate rectal sparing.  Plan:  The patient will continue to follow with urology for ongoing PSA determinations. I would anticipate a high likelihood for local tumor control with minimal risk for rectal morbidity.  ________________________________  Sheral Apley Tammi Klippel, M.D.

## 2018-10-06 ENCOUNTER — Other Ambulatory Visit: Payer: Self-pay

## 2018-10-06 ENCOUNTER — Encounter: Payer: Self-pay | Admitting: Family Medicine

## 2018-10-06 MED ORDER — HYDROCHLOROTHIAZIDE 12.5 MG PO CAPS: ORAL_CAPSULE | ORAL | 1 refills | Status: DC

## 2018-11-29 ENCOUNTER — Other Ambulatory Visit: Payer: Self-pay | Admitting: Family Medicine

## 2018-12-25 DIAGNOSIS — C61 Malignant neoplasm of prostate: Secondary | ICD-10-CM | POA: Diagnosis not present

## 2018-12-29 ENCOUNTER — Encounter: Payer: Self-pay | Admitting: Family Medicine

## 2018-12-31 ENCOUNTER — Ambulatory Visit: Payer: Medicare HMO | Admitting: Family Medicine

## 2018-12-31 ENCOUNTER — Other Ambulatory Visit: Payer: Self-pay

## 2018-12-31 ENCOUNTER — Encounter: Payer: Self-pay | Admitting: Family Medicine

## 2018-12-31 ENCOUNTER — Ambulatory Visit (INDEPENDENT_AMBULATORY_CARE_PROVIDER_SITE_OTHER): Payer: Medicare HMO | Admitting: Family Medicine

## 2018-12-31 VITALS — BP 112/70 | HR 76 | Temp 98.0°F | Resp 16 | Ht 69.0 in | Wt 207.4 lb

## 2018-12-31 DIAGNOSIS — H6983 Other specified disorders of Eustachian tube, bilateral: Secondary | ICD-10-CM | POA: Diagnosis not present

## 2018-12-31 DIAGNOSIS — H938X3 Other specified disorders of ear, bilateral: Secondary | ICD-10-CM | POA: Diagnosis not present

## 2018-12-31 MED ORDER — FLUTICASONE PROPIONATE 50 MCG/ACT NA SUSP: 2.0000 | Freq: Every day | NASAL | 2 refills | Status: DC

## 2018-12-31 MED ORDER — FEXOFENADINE HCL 180 MG PO TABS: 180.0000 mg | ORAL_TABLET | Freq: Every day | ORAL | 2 refills | Status: DC

## 2018-12-31 NOTE — Patient Instructions (Signed)

## 2018-12-31 NOTE — Progress Notes (Signed)
Subjective:    Patient ID: Jeffrey Romero, male    DOB: 1950/09/14, 68 y.o.   MRN: [id number]  HPI   Patient presents to clinic complaining of feeling as if ears are clogged.  First patient thought maybe had some earwax buildup, used at home Debrox earwax removal system.  States some wax was removed with the use of Debrox however the fullness feeling in ears persist.  Patient does not currently take any sort of allergy medicine to use a nasal spray.  States he does get seasonal allergies at times but they usually do not bother him much.  Patient Active Problem List   Diagnosis Date Noted  . Malignant neoplasm of prostate ([place]) 06/11/2018  . Shingles 02/18/2018  . Encounter for health maintenance examination with abnormal findings 11/23/2016  . BPH (benign prostatic hyperplasia) 03/01/2016  . HTN (hypertension) 03/01/2016  . 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 Hypercholesterolemia 12/07/2015  . Injury of elbow 12/07/2015  . Obese   . Osteoarthritis 07/30/2007   Social History   Tobacco Use  . Smoking status: Never Smoker  . Smokeless tobacco: Never Used  . 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 Alcohol use: Yes    Alcohol/week: 0.0 standard drinks    Comment: Occasionally wine or beer    Review of Systems  Constitutional: Negative for chills, fatigue and fever.  HENT: Negative for congestion, sinus pain and sore throat. +fullness in ears   Eyes: Negative.   Respiratory: Negative for cough, shortness of breath and wheezing.   Cardiovascular: Negative for chest pain, palpitations and leg swelling.  Gastrointestinal: Negative for abdominal pain, diarrhea, nausea and vomiting.  Genitourinary: Negative for dysuria, frequency and urgency.  Musculoskeletal: Negative for arthralgias and myalgias.  Skin: Negative for color change, pallor and rash.  Neurological: Negative for syncope,  light-headedness and headaches.  Psychiatric/Behavioral: The patient is not nervous/anxious.       Objective:   Physical Exam Vitals signs and nursing note reviewed.  Constitutional:      General: He is not in acute distress.    Appearance: He is not ill-appearing, toxic-appearing or diaphoretic.  HENT:     Head: Normocephalic and atraumatic.     Right Ear: Ear canal and external ear normal. There is no impacted cerumen.     Left Ear: Ear canal and external ear normal. There is no impacted cerumen.     Ears:     Comments: +fullness bilat TMs -- they appear pink with some fluid. Very small amount of wax seen at end of ear canal, it is not blocking the canal    Nose:     Comments: Nares appear slightly inflamed, consistent with seasonal allergy irritation    Mouth/Throat:     Mouth: Mucous membranes are moist.     Pharynx: Oropharynx is clear. No oropharyngeal exudate or posterior oropharyngeal erythema.  Eyes:     General: No scleral icterus.       Right eye: No discharge.        Left eye: No discharge.     Extraocular Movements: Extraocular movements intact.     Conjunctiva/sclera: Conjunctivae normal.     Pupils: Pupils are equal, round, and reactive to light.  Neck:     Musculoskeletal: Neck supple. No neck rigidity.  Cardiovascular:     Rate and Rhythm: Normal rate and regular rhythm.     Heart sounds: Normal  heart sounds.  Pulmonary:     Effort: Pulmonary effort is normal. No respiratory distress.     Breath sounds: Normal breath sounds.  Skin:    General: Skin is warm and dry.     Coloration: Skin is not jaundiced or pale.  Neurological:     General: No focal deficit present.     Mental Status: He is alert and oriented to person, place, and time.     Gait: Gait normal.  Psychiatric:        Mood and Affect: Mood normal.        Behavior: Behavior normal.    Vitals:   12/31/18 1016  BP: 112/70  Pulse: 76  Resp: 16  Temp: 98 F (36.7 C)  SpO2: 98%       Assessment & Plan:   Eustachian tube dysfunction, ear fullness- patient's tympanic membranes do appear full on exam and I suspect the feeling of ear fullness is related to eustachian tubes not draining properly.  We will treat patient with Flonase nasal spray daily and he will also begin a daily allergy medicine to help reduce his symptoms.  Advised that if his symptoms are not helped with the Flonase and allergy medicine to call and let us know and at that time we can consider ENT referral.  Patient encouraged to keep up good fluid intake, and try to avoid any allergens/irritants.   He will keep regularly scheduled follow-up with PCP as planned & he is aware he can return to clinic sooner if issues arise.

## 2019-01-01 ENCOUNTER — Other Ambulatory Visit (HOSPITAL_COMMUNITY): Payer: Self-pay | Admitting: Urology

## 2019-01-01 ENCOUNTER — Ambulatory Visit (HOSPITAL_COMMUNITY)
Admission: RE | Admit: 2019-01-01 | Discharge: 2019-01-01 | Disposition: A | Discharge status: Home / Self Care | Payer: Medicare HMO | Source: Ambulatory Visit | Attending: Urology | Admitting: Urology

## 2019-01-01 DIAGNOSIS — C61 Malignant neoplasm of prostate: Secondary | ICD-10-CM

## 2019-01-01 DIAGNOSIS — Z8546 Personal history of malignant neoplasm of prostate: Secondary | ICD-10-CM | POA: Diagnosis not present

## 2019-01-01 DIAGNOSIS — J984 Other disorders of lung: Secondary | ICD-10-CM | POA: Diagnosis not present

## 2019-01-13 ENCOUNTER — Encounter: Payer: Self-pay | Admitting: Family Medicine

## 2019-01-13 DIAGNOSIS — H6983 Other specified disorders of Eustachian tube, bilateral: Secondary | ICD-10-CM

## 2019-01-13 DIAGNOSIS — H938X3 Other specified disorders of ear, bilateral: Secondary | ICD-10-CM

## 2019-01-26 DIAGNOSIS — H6062 Unspecified chronic otitis externa, left ear: Secondary | ICD-10-CM | POA: Diagnosis not present

## 2019-01-26 DIAGNOSIS — B379 Candidiasis, unspecified: Secondary | ICD-10-CM | POA: Diagnosis not present

## 2019-01-26 DIAGNOSIS — H60339 Swimmer's ear, unspecified ear: Secondary | ICD-10-CM | POA: Diagnosis not present

## 2019-01-26 DIAGNOSIS — H6123 Impacted cerumen, bilateral: Secondary | ICD-10-CM | POA: Diagnosis not present

## 2019-02-09 DIAGNOSIS — H6063 Unspecified chronic otitis externa, bilateral: Secondary | ICD-10-CM | POA: Diagnosis not present

## 2019-03-17 ENCOUNTER — Encounter: Payer: Self-pay | Admitting: Family Medicine

## 2019-03-17 NOTE — Telephone Encounter (Signed)
Called Pt and scheduled him a Flu shot for 03/20/2019 @ 3:15pm

## 2019-03-20 ENCOUNTER — Ambulatory Visit (INDEPENDENT_AMBULATORY_CARE_PROVIDER_SITE_OTHER): Payer: Medicare HMO

## 2019-03-20 ENCOUNTER — Other Ambulatory Visit: Payer: Self-pay

## 2019-03-20 DIAGNOSIS — Z23 Encounter for immunization: Secondary | ICD-10-CM | POA: Diagnosis not present

## 2019-03-22 ENCOUNTER — Other Ambulatory Visit: Payer: Self-pay | Admitting: Family Medicine

## 2019-03-22 DIAGNOSIS — H6983 Other specified disorders of Eustachian tube, bilateral: Secondary | ICD-10-CM

## 2019-03-22 DIAGNOSIS — H938X3 Other specified disorders of ear, bilateral: Secondary | ICD-10-CM

## 2019-03-23 ENCOUNTER — Other Ambulatory Visit: Payer: Self-pay | Admitting: Family Medicine

## 2019-03-23 DIAGNOSIS — H938X3 Other specified disorders of ear, bilateral: Secondary | ICD-10-CM

## 2019-03-23 DIAGNOSIS — H6983 Other specified disorders of Eustachian tube, bilateral: Secondary | ICD-10-CM

## 2019-03-26 ENCOUNTER — Encounter: Payer: Self-pay | Admitting: Family Medicine

## 2019-03-26 ENCOUNTER — Other Ambulatory Visit: Payer: Self-pay | Admitting: Family Medicine

## 2019-03-26 DIAGNOSIS — N400 Enlarged prostate without lower urinary tract symptoms: Secondary | ICD-10-CM

## 2019-03-26 MED ORDER — TAMSULOSIN HCL 0.4 MG PO CAPS: 0.4000 mg | ORAL_CAPSULE | Freq: Every day | ORAL | 1 refills | Status: DC

## 2019-03-26 NOTE — Telephone Encounter (Signed)
rx refill request. Done  This med should have passed [facility] refill protocols; looks like it was a [facility] issue - he should have had a 90 day refill per the record

## 2019-03-26 NOTE — Telephone Encounter (Signed)
Please make sure he has follow up scheduled with PCP in next 3 month

## 2019-04-17 ENCOUNTER — Other Ambulatory Visit: Payer: Self-pay

## 2019-04-17 ENCOUNTER — Ambulatory Visit (INDEPENDENT_AMBULATORY_CARE_PROVIDER_SITE_OTHER): Payer: Medicare HMO

## 2019-04-17 DIAGNOSIS — Z0000 Encounter for general adult medical examination without abnormal findings: Secondary | ICD-10-CM | POA: Diagnosis not present

## 2019-04-17 NOTE — Progress Notes (Signed)
Subjective:   Jeffrey Romero is a 68 y.o. male who presents for an Initial Medicare Annual Wellness Visit.  Review of Systems  No ROS.  Medicare Wellness Virtual Visit.  Visual/audio telehealth visit, UTA vital signs.   See social history for additional risk factors.   Cardiac Risk Factors include: advanced age (>69men, >50 women);male gender;hypertension    Objective:    Today's Vitals   There is no height or weight on file to calculate BMI.  Advanced Directives 04/17/2019 08/25/2018 07/06/2017 07/01/2017  Does Patient Have a Medical Advance Directive? Yes Yes No No  Type of Paramedic of [place];Living will Living will;Healthcare Power of Attorney - -  Does patient want to make changes to medical advance directive? No - Patient declined - - -  Copy of [place] in Chart? No - copy requested No - copy requested - -  Would patient like information on creating a medical advance directive? - - No - Patient declined No - Patient declined    Current Medications (verified) Outpatient Encounter Medications as of 04/17/2019  Medication Sig  . hydrochlorothiazide (MICROZIDE) 12.5 MG capsule TAKE 1 CAPSULE BY MOUTH EVERY DAY  . ibuprofen (ADVIL,MOTRIN) 200 MG tablet Take 200 mg by mouth as needed.   . Omega-3 Fatty Acids (FISH OIL) 1200 MG CAPS Take 1,200 mg by mouth.  . simvastatin (ZOCOR) 20 MG tablet simvastatin 20 mg tablet  . tamsulosin (FLOMAX) 0.4 MG CAPS capsule Take 1 capsule (0.4 mg total) by mouth daily.  . [DISCONTINUED] fexofenadine (ALLEGRA) 180 MG tablet TAKE 1 TABLET BY MOUTH EVERY DAY (OTC)  . [DISCONTINUED] fluticasone (FLONASE) 50 MCG/ACT nasal spray SPRAY 2 SPRAYS INTO EACH NOSTRIL EVERY DAY  . [DISCONTINUED] HYDROcodone-acetaminophen (NORCO) 5-325 MG tablet Take 1 tablet by mouth every 6 (six) hours as needed for moderate pain.   No facility-administered encounter medications on file as of 04/17/2019.     Allergies  (verified) Patient has no known allergies.   History: Past Medical History:  Diagnosis Date  . BPH (benign prostatic hyperplasia) 03/01/2016  . EXTERNAL HEMORRHOIDS WITHOUT MENTION COMP   . HYPERLIPIDEMIA   . HYPERTENSION   . Osteoarthritis of knee   . Prostate cancer [facility])    Past Surgical History:  Procedure Laterality Date  . CYSTOSCOPY N/A 08/25/2018   Procedure: CYSTOSCOPY FLEXIBLE;  Surgeon: Cleon Gustin, MD;  Location: [facility];  Service: Urology;  Laterality: N/A;  NO SEEDS FOUND IN BLADDER  . Neck fusion  05 & 07  . PROSTATE BIOPSY    . RADIOACTIVE SEED IMPLANT N/A 08/25/2018   Procedure: RADIOACTIVE SEED IMPLANT/BRACHYTHERAPY IMPLANT;  Surgeon: Cleon Gustin, MD;  Location: [facility];  Service: Urology;  Laterality: N/A;   90 SEEDS FOUND IN BLADDER  . right knee surgery  2006  . SPACE OAR INSTILLATION N/A 08/25/2018   Procedure: SPACE OAR INSTILLATION;  Surgeon: Cleon Gustin, MD;  Location: [facility];  Service: Urology;  Laterality: N/A;  . TONSILLECTOMY AND ADENOIDECTOMY    . TOTAL KNEE ARTHROPLASTY  08/2010   bilateral - olin   Family History  Adopted: Yes  Family history unknown: Yes   Social History   Socioeconomic History  . Marital status: Single    Spouse name: Not on file  . Number of children: 1  . Years of education: Not on file  . Highest education level: Not on file  Occupational History  . Not  on file  Social Needs  . Financial resource strain: Not hard at all  . Food insecurity    Worry: Never true    Inability: Never true  . Transportation needs    Medical: No    Non-medical: No  Tobacco Use  . Smoking status: Never Smoker  . Smokeless tobacco: Never Used  . 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 Alcohol use: Yes    Alcohol/week: 0.0 standard drinks    Comment: Occasionally wine or beer   . Drug  use: No  . Sexual activity: Not Currently  Lifestyle  . Physical activity    Days per week: 7 days    Minutes per session: 60 min  . Stress: Not at all  Relationships  . Social Herbalist on phone: Not on file    Gets together: Not on file    Attends religious service: Not on file    Active member of club or organization: Not on file    Attends meetings of clubs or organizations: Not on file    Relationship status: Not on file  Other Topics Concern  . 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 N N  Vision? N N  Difficulty concentrating or making decisions? N N  Walking or climbing stairs? N N  Dressing or bathing? N N  Doing errands, shopping? N -  Preparing Food and eating ? N -  Using the Toilet? N -  In the past six months, have you accidently leaked urine? N -  Do you have problems with loss of bowel control? N -  Managing your Medications? N -  Managing your Finances? N -  Housekeeping or managing your Housekeeping? N -  Some recent data might be hidden     Immunizations and Health Maintenance Immunization History  Administered Date(s) Administered  . Fluad Quad(high Dose 65+) 03/20/2019  . Influenza, High Dose Seasonal PF 04/29/2017  . Influenza-Unspecified 04/22/2012, 05/04/2016,  03/06/2018  . Pneumococcal Conjugate-13 11/23/2016  . Pneumococcal Polysaccharide-23 02/20/2018  . Tdap 02/06/2011  . Zoster 02/06/2011  . Zoster Recombinat (Shingrix) 03/18/2018, 06/05/2018   There are no preventive care reminders to display for this patient.  Patient Care Team: [name], MD as PCP - General (Family Medicine) Paralee Cancel, MD (Orthopedic Surgery)  Indicate any recent Medical Services you may have received from other than [facility] providers in the past year (date may be approximate).    Assessment:   This is a routine wellness examination for Adith.  I connected with patient 04/17/19 at  1:00 PM EDT by an audio enabled telemedicine application and verified that I am speaking  with the correct person using two identifiers. Patient stated full name and DOB. Patient gave permission to continue with virtual visit. Patient's location was at home and Nurse's location was at [place] office.   Health Maintenance Due: See completed HM at the end of note.   Eye: Visual acuity not assessed. Virtual visit. Wears corrective lenses. Followed by their ophthalmologist every 12 months.   Dental: Visits every 6 months.    Hearing: Demonstrates normal hearing during visit.  Safety:  Patient feels safe at home- yes Patient does have smoke detectors at home- yes Patient does wear sunscreen or protective clothing when in direct sunlight - yes Patient does wear seat belt when in a moving vehicle - yes Patient drives- yes Adequate lighting in walkways free from debris- yes Grab bars and handrails used as appropriate- yes Ambulates with no assistive device Cell phone on person when ambulating outside of the home- yes  Social: Alcohol intake - yes, occ      Smoking history- never   Smokers in home? none Illicit drug use? none  Depression: PHQ 2 &9 complete. See screening below. Denies irritability, anhedonia, sadness/tearfullness.  Stable.   Falls: See screening  below.    Medication: Taking as directed and without issues.   Covid-19: Precautions and sickness symptoms discussed. Wears mask, social distancing, hand hygiene as appropriate.   Activities of Daily Living Patient denies needing assistance with: household chores, feeding themselves, getting from bed to chair, getting to the toilet, bathing/showering, dressing, managing money, or preparing meals.   Memory: Patient is alert. Patient denies difficulty focusing or concentrating. Correctly identified the president of the [place], season and recall. Patient likes to read for brain stimulation.  BMI- discussed the importance of a healthy diet, water intake and the benefits of aerobic exercise.  Educational material provided.  Physical activity- walking daily 2 miles  Diet:  Nutri-system Water: good intake Caffeine: 1 cup daily   Advanced Directive: End of life planning; Advance aging; Advanced directives discussed.  Copy of current HCPOA/Living Will requested.    Other Providers Patient Care Team: [name], MD as PCP - General (Family Medicine) Paralee Cancel, MD (Orthopedic Surgery) Hearing/Vision screen  Hearing Screening   125Hz  250Hz  500Hz  1000Hz  2000Hz  3000Hz  4000Hz  6000Hz  8000Hz   Right ear:           Left ear:           Comments: Patient is able to hear conversational tones without difficulty.  No issues reported.   Vision Screening Comments: Wears corrective lenses Visual acuity not assessed, virtual visit.      Dietary issues and exercise activities discussed: Current Exercise Habits: Home exercise routine, Type of exercise: walking, Frequency (Times/Week): 5, Intensity: Mild  Goals      Patient Stated   . Blood Pressure < 140/90 (pt-stated)     I should spot check my blood pressure more often. I am not having any problems but I will try to do this.       Depression Screen PHQ 2/9 Scores 04/17/2019 12/31/2018 11/23/2016 11/05/2012  PHQ - 2 Score 0 0 0 0   PHQ- 9 Score - 0 0 -    Fall Risk Fall Risk  04/17/2019 11/23/2016 11/23/2016 11/05/2012  Falls in the past year? 0 No No No   Timed Get Up and Go performed: no, virtual visit  Cognitive Function:     6CIT Screen 04/17/2019  What Year? 0 points  What month? 0 points  What time?  0 points  Count back from 20 0 points  Months in reverse 0 points  Repeat phrase 0 points  Total Score 0    Screening Tests Health Maintenance  Topic Date Due  . TETANUS/TDAP  02/05/2021  . Fecal DNA (Cologuard)  02/24/2021  . INFLUENZA VACCINE  Completed  . Hepatitis C Screening  Completed  . PNA vac Low Risk Adult  Completed      Plan:   Keep all routine maintenance appointments.   Medicare Attestation I have personally reviewed: The patient's medical and social history Their use of alcohol, tobacco or illicit drugs Their current medications and supplements The patient's functional ability including ADLs,fall risks, home safety risks, cognitive, and hearing and visual impairment Diet and physical activities Evidence for depression   In addition, I have reviewed and discussed with patient certain preventive protocols, quality metrics, and best practice recommendations. A written personalized care plan for preventive services as well as general preventive health recommendations were provided to patient via mail.     Varney Biles, LPN   [phone]

## 2019-04-17 NOTE — Patient Instructions (Addendum)
  Mr. Queener , Thank you for taking time to come for your Medicare Wellness Visit. I appreciate your ongoing commitment to your health goals. Please review the following plan we discussed and let me know if I can assist you in the future.   These are the goals we discussed: Goals      Patient Stated   . Blood Pressure < 140/90 (pt-stated)     I should spot check my blood pressure more often. I am not having any problems but I will try to do this.        This is a list of the screening recommended for you and due dates:  Health Maintenance  Topic Date Due  . Tetanus Vaccine  02/05/2021  . Cologuard (Stool DNA test)  02/24/2021  . Flu Shot  Completed  .  Hepatitis C: One time screening is recommended by Center for Disease Control  (CDC) for  adults born from [birth year] through 1966.   Completed  . Pneumonia vaccines  Completed

## 2019-06-26 ENCOUNTER — Encounter: Payer: Self-pay | Admitting: Family Medicine

## 2019-06-30 DIAGNOSIS — R69 Illness, unspecified: Secondary | ICD-10-CM | POA: Diagnosis not present

## 2019-07-03 DIAGNOSIS — C61 Malignant neoplasm of prostate: Secondary | ICD-10-CM | POA: Diagnosis not present

## 2019-07-03 DIAGNOSIS — H01002 Unspecified blepharitis right lower eyelid: Secondary | ICD-10-CM | POA: Diagnosis not present

## 2019-07-09 ENCOUNTER — Other Ambulatory Visit: Payer: Self-pay | Admitting: Family Medicine

## 2019-07-10 DIAGNOSIS — C61 Malignant neoplasm of prostate: Secondary | ICD-10-CM | POA: Diagnosis not present

## 2019-08-19 DIAGNOSIS — H40053 Ocular hypertension, bilateral: Secondary | ICD-10-CM | POA: Diagnosis not present

## 2019-08-19 DIAGNOSIS — H40013 Open angle with borderline findings, low risk, bilateral: Secondary | ICD-10-CM | POA: Diagnosis not present

## 2019-08-19 DIAGNOSIS — H1789 Other corneal scars and opacities: Secondary | ICD-10-CM | POA: Diagnosis not present

## 2019-08-19 DIAGNOSIS — H524 Presbyopia: Secondary | ICD-10-CM | POA: Diagnosis not present

## 2019-08-19 DIAGNOSIS — H5213 Myopia, bilateral: Secondary | ICD-10-CM | POA: Diagnosis not present

## 2019-08-19 DIAGNOSIS — H52223 Regular astigmatism, bilateral: Secondary | ICD-10-CM | POA: Diagnosis not present

## 2019-08-19 DIAGNOSIS — H2513 Age-related nuclear cataract, bilateral: Secondary | ICD-10-CM | POA: Diagnosis not present

## 2019-08-19 IMAGING — CR CHEST - 2 VIEW
2 series · 2 of 2 positions shown · non-contrast
Comparison: 07/04/2018

CLINICAL DATA: History of prostate carcinoma

EXAM:
CHEST - 2 VIEW

[w chest pa]
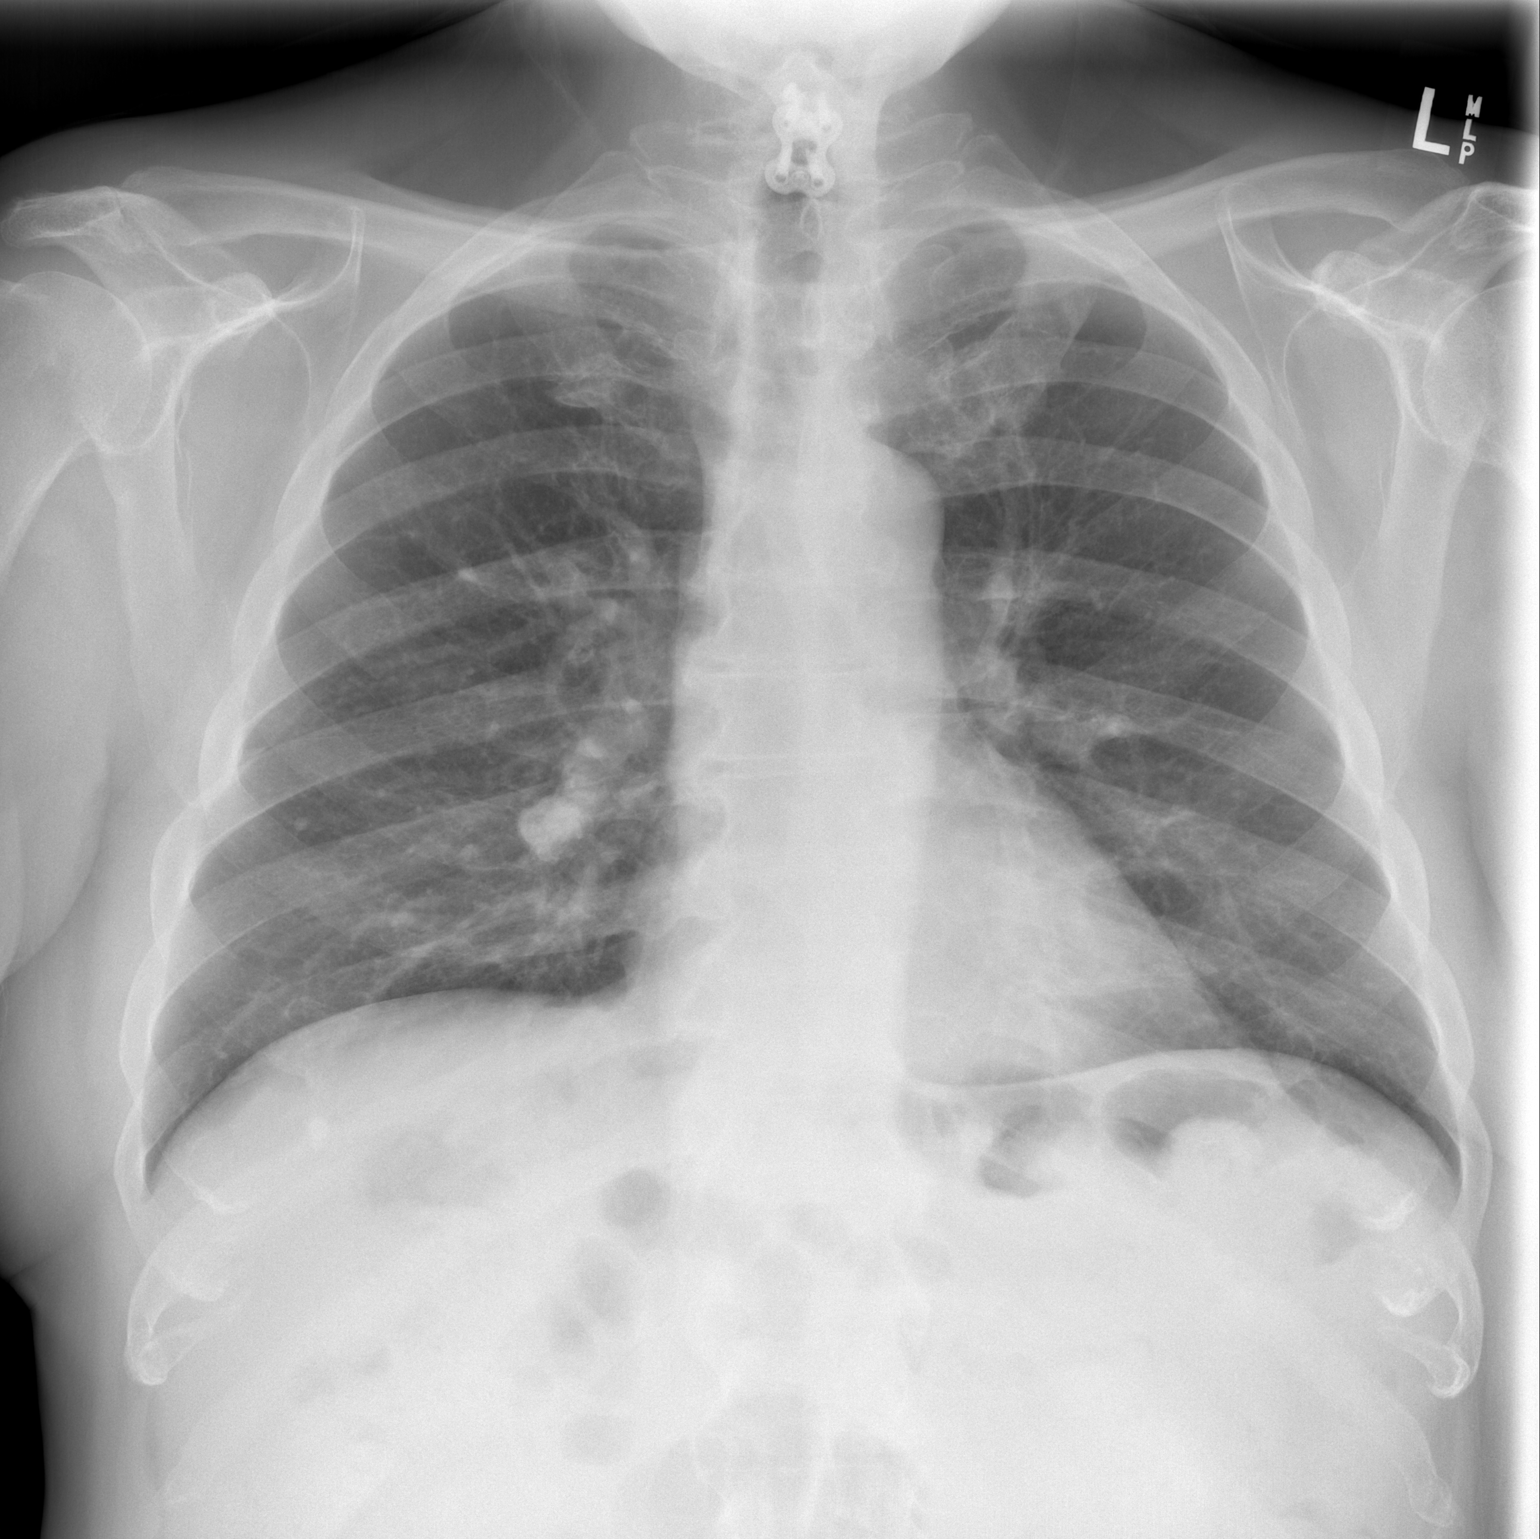

[w chest lat]
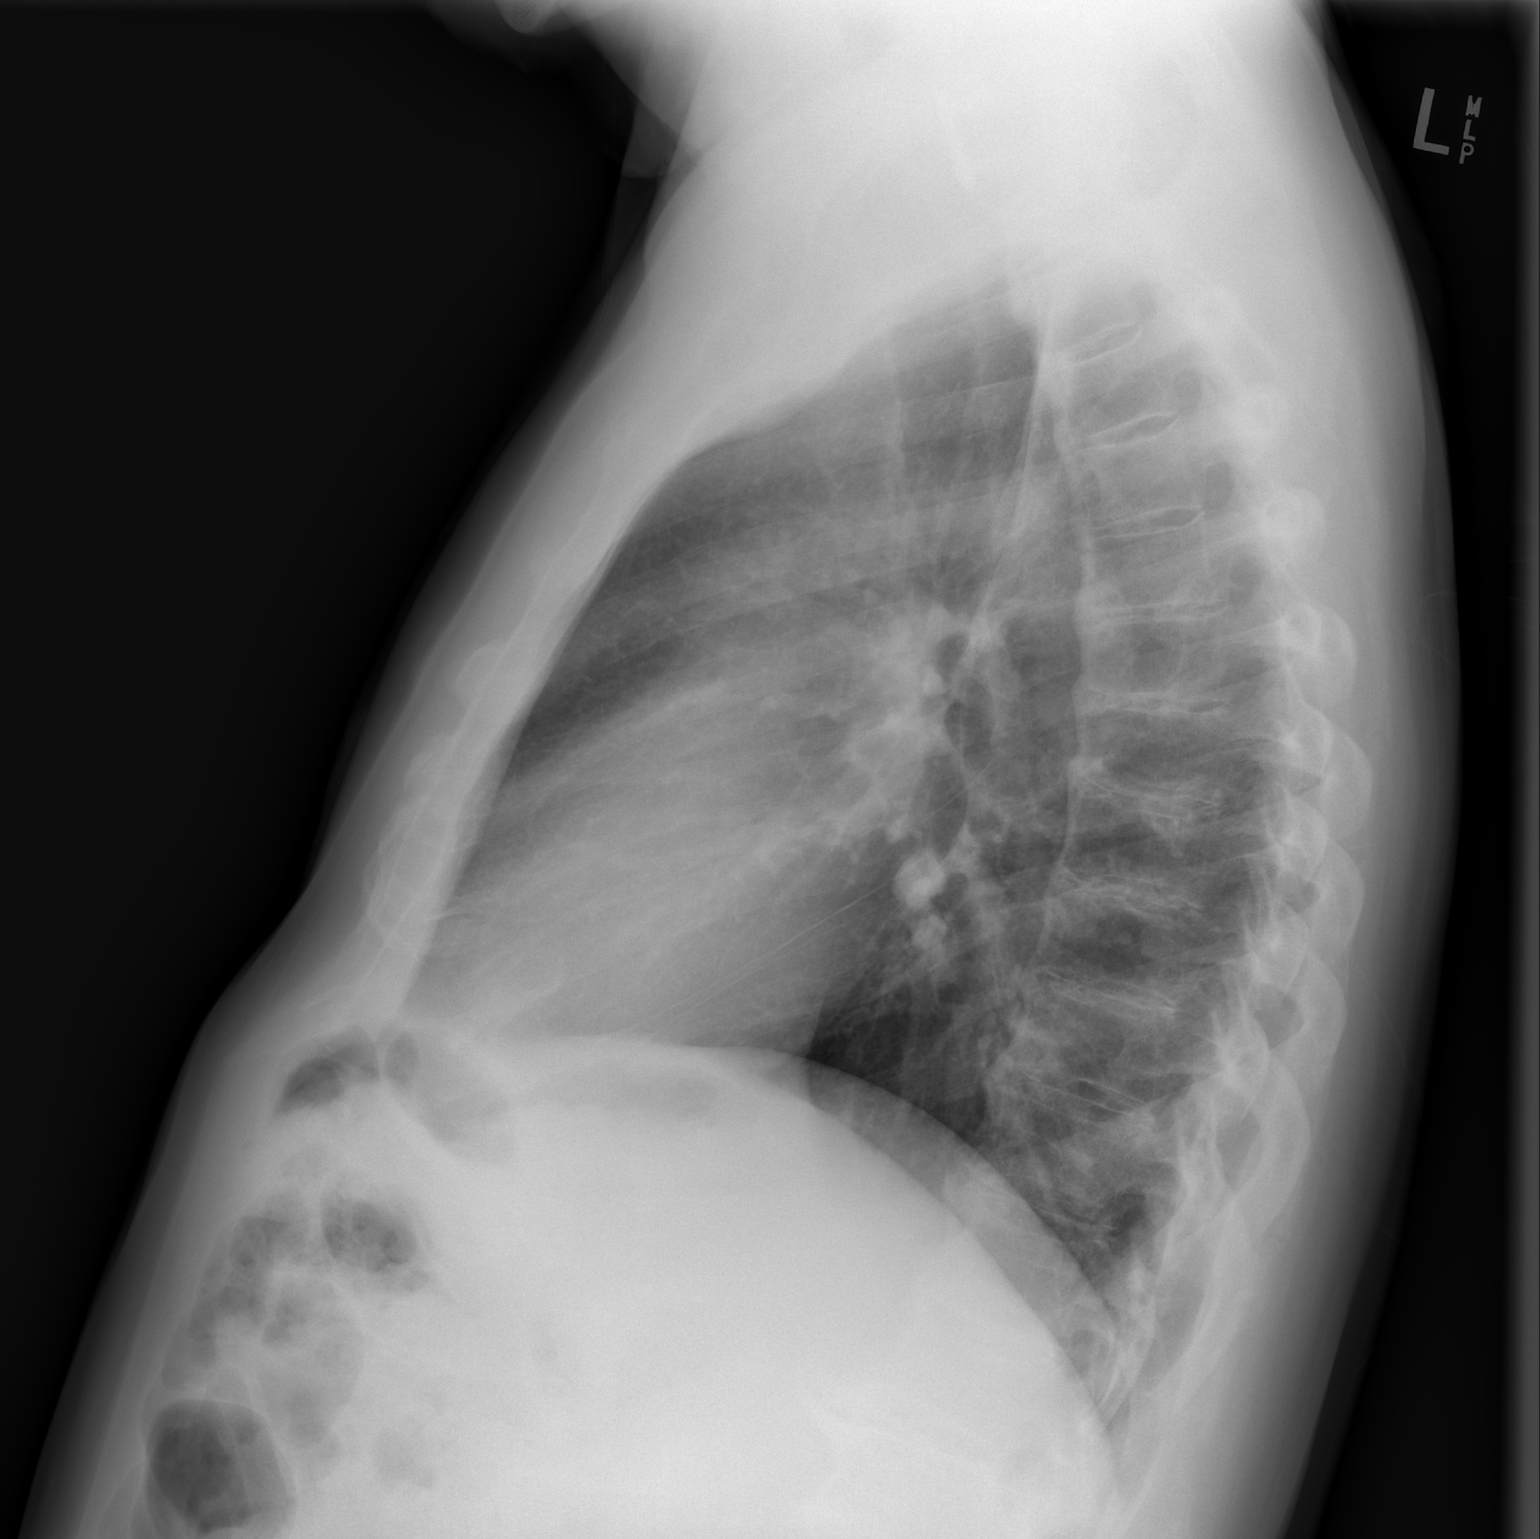

[2 of 2 positions shown; findings below may reference images not displayed]

FINDINGS: Cardiac shadows within normal limits. The lungs are well aerated
bilaterally. Stable rounded density is noted in the right infrahilar
region likely related to calcified lymph node. This is stable from
the prior exam. No acute infiltrate or effusion is noted.
Degenerative changes of the thoracic spine are noted.
IMPRESSION: No acute abnormality noted.

## 2019-08-23 ENCOUNTER — Ambulatory Visit: Payer: Medicare HMO

## 2019-08-24 DIAGNOSIS — H2511 Age-related nuclear cataract, right eye: Secondary | ICD-10-CM | POA: Diagnosis not present

## 2019-08-24 DIAGNOSIS — H2513 Age-related nuclear cataract, bilateral: Secondary | ICD-10-CM | POA: Diagnosis not present

## 2019-08-24 DIAGNOSIS — H25043 Posterior subcapsular polar age-related cataract, bilateral: Secondary | ICD-10-CM | POA: Diagnosis not present

## 2019-08-24 DIAGNOSIS — H25013 Cortical age-related cataract, bilateral: Secondary | ICD-10-CM | POA: Diagnosis not present

## 2019-08-29 ENCOUNTER — Ambulatory Visit: Payer: Medicare HMO | Attending: Internal Medicine

## 2019-08-29 DIAGNOSIS — Z23 Encounter for immunization: Secondary | ICD-10-CM | POA: Insufficient documentation

## 2019-08-29 NOTE — Progress Notes (Signed)
   Covid-19 Vaccination Clinic  Name:  Jeffrey Romero    MRN: [id number] DOB: May 26, 1951  08/29/2019  [name] was observed post Covid-19 immunization for 15 minutes without incidence. He was provided with Vaccine Information Sheet and instruction to access the V-Safe system.   [name] was instructed to call 911 with any severe reactions post vaccine: Marland Kitchen Difficulty breathing  . Swelling of your face and throat  . A fast heartbeat  . A bad rash all over your body  . Dizziness and weakness    Immunizations Administered    Name Date Dose VIS Date Route   Pfizer COVID-19 Vaccine 08/29/2019 12:21 PM 0.3 mL 07/03/2019 Intramuscular   Manufacturer: [place]   Lot: [id number]   [place]: [id number]

## 2019-09-03 ENCOUNTER — Ambulatory Visit: Payer: Medicare HMO

## 2019-09-17 DIAGNOSIS — Z961 Presence of intraocular lens: Secondary | ICD-10-CM | POA: Diagnosis not present

## 2019-09-17 DIAGNOSIS — H2511 Age-related nuclear cataract, right eye: Secondary | ICD-10-CM | POA: Diagnosis not present

## 2019-09-17 DIAGNOSIS — H25011 Cortical age-related cataract, right eye: Secondary | ICD-10-CM | POA: Diagnosis not present

## 2019-09-17 DIAGNOSIS — Z9841 Cataract extraction status, right eye: Secondary | ICD-10-CM | POA: Diagnosis not present

## 2019-09-18 DIAGNOSIS — H25012 Cortical age-related cataract, left eye: Secondary | ICD-10-CM | POA: Diagnosis not present

## 2019-09-18 DIAGNOSIS — H25042 Posterior subcapsular polar age-related cataract, left eye: Secondary | ICD-10-CM | POA: Diagnosis not present

## 2019-09-18 DIAGNOSIS — H2512 Age-related nuclear cataract, left eye: Secondary | ICD-10-CM | POA: Diagnosis not present

## 2019-09-22 ENCOUNTER — Ambulatory Visit: Payer: Medicare HMO | Attending: Internal Medicine

## 2019-09-22 DIAGNOSIS — Z23 Encounter for immunization: Secondary | ICD-10-CM

## 2019-09-22 NOTE — Progress Notes (Signed)
   Covid-19 Vaccination Clinic  Name:  JERIMEY WEIBLEY    MRN: [id number] DOB: 1950/11/23  09/22/2019  Mr. Palin was observed post Covid-19 immunization for 15 minutes without incident. He was provided with Vaccine Information Sheet and instruction to access the V-Safe system.   Mr. Soldano was instructed to call 911 with any severe reactions post vaccine: Marland Kitchen Difficulty breathing  . Swelling of face and throat  . A fast heartbeat  . A bad rash all over body  . Dizziness and weakness   Immunizations Administered    Name Date Dose VIS Date Route   Pfizer COVID-19 Vaccine 09/22/2019  9:08 AM 0.3 mL 07/03/2019 Intramuscular   Manufacturer: [place]   Lot: [id number]   [place]: [id number]

## 2019-09-24 DIAGNOSIS — L821 Other seborrheic keratosis: Secondary | ICD-10-CM | POA: Diagnosis not present

## 2019-09-24 DIAGNOSIS — L309 Dermatitis, unspecified: Secondary | ICD-10-CM | POA: Diagnosis not present

## 2019-09-24 DIAGNOSIS — L820 Inflamed seborrheic keratosis: Secondary | ICD-10-CM | POA: Diagnosis not present

## 2019-09-27 ENCOUNTER — Encounter: Payer: Self-pay | Admitting: Family Medicine

## 2019-09-27 DIAGNOSIS — N400 Enlarged prostate without lower urinary tract symptoms: Secondary | ICD-10-CM

## 2019-09-27 DIAGNOSIS — I10 Essential (primary) hypertension: Secondary | ICD-10-CM

## 2019-09-28 MED ORDER — HYDROCHLOROTHIAZIDE 12.5 MG PO CAPS: 12.5000 mg | ORAL_CAPSULE | Freq: Every day | ORAL | 0 refills | Status: DC

## 2019-09-28 MED ORDER — TAMSULOSIN HCL 0.4 MG PO CAPS: 0.4000 mg | ORAL_CAPSULE | Freq: Every day | ORAL | 0 refills | Status: DC

## 2019-09-30 ENCOUNTER — Other Ambulatory Visit: Payer: Self-pay | Admitting: Family Medicine

## 2019-09-30 DIAGNOSIS — N400 Enlarged prostate without lower urinary tract symptoms: Secondary | ICD-10-CM

## 2019-09-30 DIAGNOSIS — I10 Essential (primary) hypertension: Secondary | ICD-10-CM

## 2019-10-01 MED ORDER — TAMSULOSIN HCL 0.4 MG PO CAPS: 0.4000 mg | ORAL_CAPSULE | Freq: Every day | ORAL | 0 refills | Status: DC

## 2019-10-01 MED ORDER — HYDROCHLOROTHIAZIDE 12.5 MG PO CAPS: 12.5000 mg | ORAL_CAPSULE | Freq: Every day | ORAL | 0 refills | Status: DC

## 2019-10-08 DIAGNOSIS — Z961 Presence of intraocular lens: Secondary | ICD-10-CM | POA: Diagnosis not present

## 2019-10-08 DIAGNOSIS — Z9842 Cataract extraction status, left eye: Secondary | ICD-10-CM | POA: Diagnosis not present

## 2019-10-08 DIAGNOSIS — H25012 Cortical age-related cataract, left eye: Secondary | ICD-10-CM | POA: Diagnosis not present

## 2019-10-08 DIAGNOSIS — Z9841 Cataract extraction status, right eye: Secondary | ICD-10-CM | POA: Diagnosis not present

## 2019-10-08 DIAGNOSIS — H2512 Age-related nuclear cataract, left eye: Secondary | ICD-10-CM | POA: Diagnosis not present

## 2019-11-04 ENCOUNTER — Encounter: Payer: Self-pay | Admitting: Family Medicine

## 2019-11-04 ENCOUNTER — Other Ambulatory Visit: Payer: Self-pay

## 2019-11-04 ENCOUNTER — Ambulatory Visit (INDEPENDENT_AMBULATORY_CARE_PROVIDER_SITE_OTHER): Payer: Medicare HMO | Admitting: Family Medicine

## 2019-11-04 DIAGNOSIS — N400 Enlarged prostate without lower urinary tract symptoms: Secondary | ICD-10-CM | POA: Diagnosis not present

## 2019-11-04 DIAGNOSIS — E7800 Pure hypercholesterolemia, unspecified: Secondary | ICD-10-CM | POA: Diagnosis not present

## 2019-11-04 DIAGNOSIS — I10 Essential (primary) hypertension: Secondary | ICD-10-CM | POA: Diagnosis not present

## 2019-11-04 DIAGNOSIS — C61 Malignant neoplasm of prostate: Secondary | ICD-10-CM

## 2019-11-04 LAB — COMPREHENSIVE METABOLIC PANEL
ALT: 34 U/L (ref 0–53)
AST: 29 U/L (ref 0–37)
Albumin: 4.6 g/dL (ref 3.5–5.2)
Alkaline Phosphatase: 67 U/L (ref 39–117)
BUN: 19 mg/dL (ref 6–23)
CO2: 30 mEq/L (ref 19–32)
Calcium: 9.7 mg/dL (ref 8.4–10.5)
Chloride: 101 mEq/L (ref 96–112)
Creatinine, Ser: 0.92 mg/dL (ref 0.40–1.50)
GFR: 81.48 mL/min (ref >60.00)
Glucose, Bld: 102 mg/dL — ABNORMAL HIGH (ref 70–99)
Potassium: 4.1 mEq/L (ref 3.5–5.1)
Sodium: 139 mEq/L (ref 135–145)
Total Bilirubin: 0.9 mg/dL (ref 0.2–1.2)
Total Protein: 7.2 g/dL (ref 6.0–8.3)

## 2019-11-04 LAB — LIPID PANEL
Cholesterol: 193 mg/dL (ref 0–200)
HDL: 67.9 mg/dL (ref >39.00)
LDL Cholesterol: 103 mg/dL — ABNORMAL HIGH (ref 0–99)
NonHDL: 124.83
Total CHOL/HDL Ratio: 3
Triglycerides: 111 mg/dL (ref 0.0–149.0)
VLDL: 22.2 mg/dL (ref 0.0–40.0)

## 2019-11-04 LAB — HEMOGLOBIN A1C: Hgb A1c MFr Bld: 5.1 % (ref 4.6–6.5)

## 2019-11-04 MED ORDER — HYDROCHLOROTHIAZIDE 12.5 MG PO CAPS: 12.5000 mg | ORAL_CAPSULE | Freq: Every day | ORAL | 0 refills | Status: DC

## 2019-11-04 MED ORDER — TAMSULOSIN HCL 0.4 MG PO CAPS: 0.4000 mg | ORAL_CAPSULE | Freq: Every day | ORAL | 0 refills | Status: DC

## 2019-11-04 NOTE — Progress Notes (Signed)
  [name], MD Phone: [phone]  KERRIGAN GLENDENING is a 69 y.o. male who presents today for f/u.  HYPERTENSION  Disease Monitoring  Home BP Monitoring 022M systolic Chest pain- no    Dyspnea- no Medications  Compliance-  Taking HCTZ.   Edema- no  BPH: Strain- no Flow- no Frequency- no Urgency- no Emptying bladder- yes Medication- flomax  History of prostate cancer: Patient status post brachytherapy.  Notes this is doing well.  He reports his last PSA was 0.03.  He follows with urology.  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    Appearance: He is not diaphoretic.  Cardiovascular:     Rate and Rhythm: Normal rate and regular rhythm.     Heart sounds: Normal heart sounds.  Pulmonary:     Effort: Pulmonary effort is normal.     Breath sounds: Normal breath sounds.  Musculoskeletal:     Right lower leg: No edema.     Left lower leg: No edema.  Skin:    General: Skin is warm and dry.  Neurological:     Mental Status: He is alert.      Assessment/Plan: Please see individual problem list.  HTN (hypertension) Adequate control.  Continue current regimen.  Check CMP.  Malignant neoplasm of prostate ([place]) Status  post brachytherapy.  He will continue to see urology.  BPH (benign prostatic hyperplasia) Doing well on Flomax.  He will continue this.  Hypercholesterolemia Continue simvastatin.  Check lipid panel.   Orders Placed This Encounter  Procedures  . Comp Met (CMET)  . Lipid panel  . HgB A1c    Meds ordered this encounter  Medications  . hydrochlorothiazide (MICROZIDE) 12.5 MG capsule    Sig: Take 1 capsule (12.5 mg total) by mouth daily.    Dispense:  30 capsule    Refill:  0  . tamsulosin (FLOMAX) 0.4 MG CAPS capsule    Sig: Take 1 capsule (0.4 mg total) by mouth daily.    Dispense:  30 capsule    Refill:  0    This visit occurred during the SARS-CoV-2 public health emergency.  Safety protocols were in place, including screening questions prior to the visit, additional usage of staff PPE, and extensive cleaning of exam room while observing appropriate contact time as indicated for disinfecting solutions.    [name], MD [place]

## 2019-11-04 NOTE — Assessment & Plan Note (Signed)
Continue simvastatin.  Check lipid panel. 

## 2019-11-04 NOTE — Assessment & Plan Note (Addendum)
Adequate control.  Continue current regimen.  Check CMP.

## 2019-11-04 NOTE — Assessment & Plan Note (Signed)
Doing well on Flomax.  He will continue this.

## 2019-11-04 NOTE — Assessment & Plan Note (Signed)
Status post brachytherapy.  He will continue to see urology.

## 2019-11-04 NOTE — Patient Instructions (Signed)
Nice to see you. We will get labs today. Please monitor your blood pressure and if it trends up please let us know.

## 2019-11-05 ENCOUNTER — Other Ambulatory Visit: Payer: Self-pay | Admitting: Family Medicine

## 2019-11-05 ENCOUNTER — Other Ambulatory Visit: Payer: Self-pay | Admitting: *Deleted

## 2019-11-05 DIAGNOSIS — E785 Hyperlipidemia, unspecified: Secondary | ICD-10-CM

## 2019-11-05 DIAGNOSIS — Z0100 Encounter for examination of eyes and vision without abnormal findings: Secondary | ICD-10-CM | POA: Diagnosis not present

## 2019-11-05 MED ORDER — SIMVASTATIN 40 MG PO TABS: ORAL_TABLET | ORAL | 1 refills | Status: DC

## 2019-11-16 ENCOUNTER — Ambulatory Visit: Payer: Medicare HMO | Admitting: Dermatology

## 2019-11-19 ENCOUNTER — Other Ambulatory Visit: Payer: Self-pay | Admitting: Family Medicine

## 2019-11-19 DIAGNOSIS — N400 Enlarged prostate without lower urinary tract symptoms: Secondary | ICD-10-CM

## 2019-11-27 ENCOUNTER — Other Ambulatory Visit: Payer: Self-pay | Admitting: Family Medicine

## 2019-11-27 DIAGNOSIS — I10 Essential (primary) hypertension: Secondary | ICD-10-CM

## 2019-12-13 ENCOUNTER — Other Ambulatory Visit: Payer: Self-pay | Admitting: Family Medicine

## 2019-12-13 DIAGNOSIS — N400 Enlarged prostate without lower urinary tract symptoms: Secondary | ICD-10-CM

## 2019-12-20 ENCOUNTER — Other Ambulatory Visit: Payer: Self-pay | Admitting: Family Medicine

## 2019-12-20 DIAGNOSIS — I10 Essential (primary) hypertension: Secondary | ICD-10-CM

## 2019-12-28 ENCOUNTER — Other Ambulatory Visit (INDEPENDENT_AMBULATORY_CARE_PROVIDER_SITE_OTHER): Payer: Medicare HMO

## 2019-12-28 ENCOUNTER — Other Ambulatory Visit: Payer: Self-pay

## 2019-12-28 DIAGNOSIS — E785 Hyperlipidemia, unspecified: Secondary | ICD-10-CM | POA: Diagnosis not present

## 2019-12-28 LAB — HEPATIC FUNCTION PANEL
ALT: 37 U/L (ref 0–53)
AST: 31 U/L (ref 0–37)
Albumin: 4.6 g/dL (ref 3.5–5.2)
Alkaline Phosphatase: 62 U/L (ref 39–117)
Bilirubin, Direct: 0.2 mg/dL (ref 0.0–0.3)
Total Bilirubin: 0.7 mg/dL (ref 0.2–1.2)
Total Protein: 7.1 g/dL (ref 6.0–8.3)

## 2019-12-28 LAB — LDL CHOLESTEROL, DIRECT: Direct LDL: 82 mg/dL

## 2020-01-14 DIAGNOSIS — C61 Malignant neoplasm of prostate: Secondary | ICD-10-CM | POA: Diagnosis not present

## 2020-01-17 ENCOUNTER — Other Ambulatory Visit: Payer: Self-pay | Admitting: Family Medicine

## 2020-01-17 DIAGNOSIS — I10 Essential (primary) hypertension: Secondary | ICD-10-CM

## 2020-01-18 ENCOUNTER — Ambulatory Visit: Payer: Medicare HMO | Admitting: Dermatology

## 2020-01-21 DIAGNOSIS — C61 Malignant neoplasm of prostate: Secondary | ICD-10-CM | POA: Diagnosis not present

## 2020-02-02 ENCOUNTER — Other Ambulatory Visit: Payer: Self-pay | Admitting: Family Medicine

## 2020-02-02 DIAGNOSIS — N400 Enlarged prostate without lower urinary tract symptoms: Secondary | ICD-10-CM

## 2020-02-12 ENCOUNTER — Other Ambulatory Visit: Payer: Self-pay | Admitting: Family Medicine

## 2020-02-12 DIAGNOSIS — I10 Essential (primary) hypertension: Secondary | ICD-10-CM

## 2020-02-24 ENCOUNTER — Other Ambulatory Visit: Payer: Self-pay | Admitting: Family Medicine

## 2020-02-24 DIAGNOSIS — N400 Enlarged prostate without lower urinary tract symptoms: Secondary | ICD-10-CM

## 2020-03-03 DIAGNOSIS — H04123 Dry eye syndrome of bilateral lacrimal glands: Secondary | ICD-10-CM | POA: Diagnosis not present

## 2020-03-03 DIAGNOSIS — H40013 Open angle with borderline findings, low risk, bilateral: Secondary | ICD-10-CM | POA: Diagnosis not present

## 2020-03-05 ENCOUNTER — Other Ambulatory Visit: Payer: Self-pay | Admitting: Family Medicine

## 2020-03-05 DIAGNOSIS — I10 Essential (primary) hypertension: Secondary | ICD-10-CM

## 2020-03-19 ENCOUNTER — Other Ambulatory Visit: Payer: Self-pay | Admitting: Family Medicine

## 2020-03-19 DIAGNOSIS — N400 Enlarged prostate without lower urinary tract symptoms: Secondary | ICD-10-CM

## 2020-04-07 ENCOUNTER — Other Ambulatory Visit: Payer: Self-pay | Admitting: Family Medicine

## 2020-04-07 DIAGNOSIS — I10 Essential (primary) hypertension: Secondary | ICD-10-CM

## 2020-04-08 ENCOUNTER — Encounter: Payer: Self-pay | Admitting: Family Medicine

## 2020-04-10 DIAGNOSIS — Z20822 Contact with and (suspected) exposure to covid-19: Secondary | ICD-10-CM | POA: Diagnosis not present

## 2020-04-13 ENCOUNTER — Other Ambulatory Visit: Payer: Self-pay | Admitting: Family Medicine

## 2020-04-13 DIAGNOSIS — N400 Enlarged prostate without lower urinary tract symptoms: Secondary | ICD-10-CM

## 2020-04-19 ENCOUNTER — Ambulatory Visit (INDEPENDENT_AMBULATORY_CARE_PROVIDER_SITE_OTHER): Payer: Medicare HMO

## 2020-04-19 VITALS — Ht 69.0 in | Wt 222.0 lb

## 2020-04-19 DIAGNOSIS — Z0000 Encounter for general adult medical examination without abnormal findings: Secondary | ICD-10-CM

## 2020-04-19 NOTE — Progress Notes (Signed)
Subjective:   Jeffrey Romero is a 69 y.o. male who presents for Medicare Annual/Subsequent preventive examination.  Review of Systems    No ROS.  Medicare Wellness Virtual Visit.    Cardiac Risk Factors include: advanced age (>69men, >9 women);hypertension;male gender     Objective:    Today's Vitals   04/19/20 1309  Weight: 222 lb (100.7 kg)  Height: 5\' 9"  (1.753 m)   Body mass index is 32.78 kg/m.  Advanced Directives 04/19/2020 04/17/2019 08/25/2018 07/06/2017 07/01/2017  Does Patient Have a Medical Advance Directive? Yes Yes Yes No No  Type of Paramedic of [place];Living will [place];Living will Living will;Healthcare Power of Attorney - -  Does patient want to make changes to medical advance directive? No - Patient declined No - Patient declined - - -  Copy of [place] in Chart? No - copy requested No - copy requested No - copy requested - -  Would patient like information on creating a medical advance directive? - - - No - Patient declined No - Patient declined    Current Medications (verified) Outpatient Encounter Medications as of 04/19/2020  Medication Sig  . hydrochlorothiazide (MICROZIDE) 12.5 MG capsule TAKE 1 CAPSULE BY MOUTH EVERY DAY  . ibuprofen (ADVIL,MOTRIN) 200 MG tablet Take 200 mg by mouth as needed.   . Omega-3 Fatty Acids (FISH OIL) 1200 MG CAPS Take 1,200 mg by mouth.  . simvastatin (ZOCOR) 40 MG tablet TAKE 1 TABLET BY MOUTH EVERY DAY  . tamsulosin (FLOMAX) 0.4 MG CAPS capsule TAKE 1 CAPSULE BY MOUTH EVERY DAY   No facility-administered encounter medications on file as of 04/19/2020.    Allergies (verified) Patient has no known allergies.   History: Past Medical History:  Diagnosis Date  . BPH (benign prostatic hyperplasia) 03/01/2016  . EXTERNAL HEMORRHOIDS WITHOUT MENTION COMP   . HYPERLIPIDEMIA   . HYPERTENSION   . Osteoarthritis of knee   . Prostate cancer [facility])    Past  Surgical History:  Procedure Laterality Date  . CYSTOSCOPY N/A 08/25/2018   Procedure: CYSTOSCOPY FLEXIBLE;  Surgeon: Cleon Gustin, MD;  Location: [facility];  Service: Urology;  Laterality: N/A;  NO SEEDS FOUND IN BLADDER  . Neck fusion  05 & 07  . PROSTATE BIOPSY    . RADIOACTIVE SEED IMPLANT N/A 08/25/2018   Procedure: RADIOACTIVE SEED IMPLANT/BRACHYTHERAPY IMPLANT;  Surgeon: Cleon Gustin, MD;  Location: [facility];  Service: Urology;  Laterality: N/A;   6 SEEDS FOUND IN BLADDER  . right knee surgery  2006  . SPACE OAR INSTILLATION N/A 08/25/2018   Procedure: SPACE OAR INSTILLATION;  Surgeon: Cleon Gustin, MD;  Location: [facility];  Service: Urology;  Laterality: N/A;  . TONSILLECTOMY AND ADENOIDECTOMY    . TOTAL KNEE ARTHROPLASTY  08/2010   bilateral - olin   Family History  Adopted: Yes  Family history unknown: Yes   Social History   Socioeconomic History  . Marital status: Single    Spouse name: Not on file  . Number of children: 1  . Years of education: Not on file  . Highest education level: Not on file  Occupational History  . Not on file  Tobacco Use  . Smoking status: Never Smoker  . Smokeless tobacco: Never Used  . 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 Vaping Use: Never used  Substance  and Sexual Activity  . Alcohol use: Yes    Alcohol/week: 0.0 standard drinks    Comment: Occasionally wine or beer   . Drug use: No  . Sexual activity: Not Currently  Other Topics Concern  . Not on file  Social History Narrative   Retired    Lives by himself    Pets: 1 dog inside    Caffeine- 1 cup coffee    Right handed    Enjoys yard work, Physiological scientist Strain: [place]   . Difficulty of Paying Living Expenses: Not hard at all  Food Insecurity: No Food Insecurity  . Worried About Sales executive in the Last Year: Never true  . Ran Out of Food in the Last Year: Never true  Transportation Needs: No Transportation Needs  . Lack of Transportation (Medical): No  . Lack of Transportation (Non-Medical): No  Physical Activity: Sufficiently Active  . Days of Exercise per Week: 7 days  . Minutes of Exercise per Session: 40 min  Stress: No Stress Concern Present  . Feeling of Stress : Not at all  Social Connections:   . Frequency of Communication with Friends and Family: Not on file  . Frequency of Social Gatherings with Friends and Family: Not on file  . Attends Religious Services: Not on file  . Active Member of Clubs or Organizations: Not on file  . Attends Archivist Meetings: Not on file  . 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 N  Vision? N  Difficulty concentrating or making decisions? N  Walking or climbing stairs? N  Dressing or bathing? N  Doing errands, shopping? N  Preparing Food and eating ? N  Using the Toilet? N  In the past six months, have you accidently leaked urine? N  Do you have problems with loss of bowel control? N  Managing your Medications? N  Managing your Finances? N  Housekeeping or managing your Housekeeping? N  Some recent data might be hidden    Patient Care Team: [name], MD as PCP - General (Family Medicine) Paralee Cancel, MD (Orthopedic Surgery)  Indicate any recent Medical Services you may have received from  other than [facility] providers in the past year (date may be approximate).     Assessment:   This is a routine wellness examination for Jeffrey Romero.  I connected with Toure today by telephone and verified that I am speaking with the correct person using two identifiers. Location patient: home Location provider: work Persons participating in the virtual visit: patient, Marine scientist.    I discussed the limitations, risks, security and privacy concerns of performing an evaluation and management service by telephone and the availability of in person appointments. The patient expressed understanding and verbally consented to this telephonic visit.    Interactive audio and video telecommunications were attempted between this provider and patient, however failed, due to patient having  technical difficulties OR patient did not have access to video capability.  We continued and completed visit with audio only.  Some vital signs may be absent or patient reported.   Hearing/Vision screen  Hearing Screening   125Hz  250Hz  500Hz  1000Hz  2000Hz  3000Hz  4000Hz  6000Hz  8000Hz   Right ear:           Left ear:           Comments: Patient is able to hear conversational tones without difficulty.  No issues reported.  Vision Screening Comments: Wears corrective lenses Visual acuity not assessed, virtual visit.       Dietary issues and exercise activities discussed:   Healthy diet Good water intake  Goals    . Follow up with Primary Care Provider     As needed      Depression Screen PHQ 2/9 Scores 04/19/2020 11/04/2019 04/17/2019 12/31/2018 11/23/2016 11/05/2012  PHQ - 2 Score 0 0 0 0 0 0  PHQ- 9 Score - - - 0 0 -    Fall Risk Fall Risk  04/19/2020 11/04/2019 04/17/2019 11/23/2016 11/23/2016  Falls in the past year? 0 0 0 No No  Number falls in past yr: 0 0 - - -  Injury with Fall? 0 - - - -  Follow up Falls evaluation completed Falls evaluation completed - - -   Handrails in use when climbing stairs? Yes Home free of  loose throw rugs in walkways, pet beds, electrical cords, etc? Yes  Adequate lighting in your home to reduce risk of falls? Yes   ASSISTIVE DEVICES UTILIZED TO PREVENT FALLS: Use of a cane, walker or w/c? No   TIMED UP AND GO:  Was the test performed? No . Virtual visit.   Cognitive Function: Patient is alert and oriented x3.  Denies difficulty focusing, making decisions, memory loss, focusing.      6CIT Screen 04/17/2019  What Year? 0 points  What month? 0 points  What time? 0 points  Count back from 20 0 points  Months in reverse 0 points  Repeat phrase 0 points  Total Score 0    Immunizations Immunization History  Administered Date(s) Administered  . Fluad Quad(high Dose 65+) 03/20/2019  . Influenza, High Dose Seasonal PF 04/29/2017  . Influenza-Unspecified 04/22/2012, 05/04/2016, 03/06/2018  . PFIZER SARS-COV-2 Vaccination 08/29/2019, 09/22/2019  . Pneumococcal Conjugate-13 11/23/2016  . Pneumococcal Polysaccharide-23 02/20/2018  . Tdap 02/06/2011  . Zoster 02/06/2011  . Zoster Recombinat (Shingrix) 03/13/2018, 05/18/2018, 06/02/2018    Health Maintenance There are no preventive care reminders to display for this patient. Health Maintenance  Topic Date Due  . INFLUENZA VACCINE  10/20/2020 (Originally 02/21/2020)  . TETANUS/TDAP  02/05/2021  . Fecal DNA (Cologuard)  02/24/2021  . COVID-19 Vaccine  Completed  . Hepatitis C Screening  Completed  . PNA vac Low Risk Adult  Completed    Dental Screening: Recommended annual dental exams for proper oral hygiene.  Community Resource Referral / Chronic Care Management: CRR required this visit?  No   CCM required this visit?  No      Plan:   Keep all routine maintenance appointments.   Follow up 05/11/20 @ 2:45  I have personally reviewed and noted the following in the patient's chart:   . Medical and social history . Use of alcohol, tobacco or illicit drugs  . Current medications and  supplements . Functional ability and status . Nutritional status . Physical activity . Advanced directives . List of other physicians . Hospitalizations,  surgeries, and ER visits in previous 12 months . Vitals . Screenings to include cognitive, depression, and falls . Referrals and appointments  In addition, I have reviewed and discussed with patient certain preventive protocols, quality metrics, and best practice recommendations. A written personalized care plan for preventive services as well as general preventive health recommendations were provided to patient via mychart.     Varney Biles, LPN   [id number]

## 2020-04-19 NOTE — Patient Instructions (Addendum)
Jeffrey Romero , Thank you for taking time to come for your Medicare Wellness Visit. I appreciate your ongoing commitment to your health goals. Please review the following plan we discussed and let me know if I can assist you in the future.   These are the goals we discussed: Goals    . Follow up with Primary Care Provider     As needed       This is a list of the screening recommended for you and due dates:  Health Maintenance  Topic Date Due  . Flu Shot  10/20/2020*  . Tetanus Vaccine  02/05/2021  . Cologuard (Stool DNA test)  02/24/2021  . COVID-19 Vaccine  Completed  .  Hepatitis C: One time screening is recommended by Center for Disease Control  (CDC) for  adults born from [birth year] through 1966.   Completed  . Pneumonia vaccines  Completed  *Topic was postponed. The date shown is not the original due date.    Immunizations Immunization History  Administered Date(s) Administered  . Fluad Quad(high Dose 65+) 03/20/2019  . Influenza, High Dose Seasonal PF 04/29/2017  . Influenza-Unspecified 04/22/2012, 05/04/2016, 03/06/2018  . PFIZER SARS-COV-2 Vaccination 08/29/2019, 09/22/2019  . Pneumococcal Conjugate-13 11/23/2016  . Pneumococcal Polysaccharide-23 02/20/2018  . Tdap 02/06/2011  . Zoster 02/06/2011  . Zoster Recombinat (Shingrix) 03/13/2018, 05/18/2018, 06/02/2018   Keep all routine maintenance appointments.   Follow up 05/11/20 @ 2:45  Advanced directives: End of life planning; Advance aging; Advanced directives discussed.  Copy of current HCPOA/Living Will requested.    Conditions/risks identified: none new.   Follow up in one year for your annual wellness visit.  Preventive Care 44 Years and Older, Male Preventive care refers to lifestyle choices and visits with your health care provider that can promote health and wellness. What does preventive care include?  A yearly physical exam. This is also called an annual well check.  Dental exams once or twice a  year.  Routine eye exams. Ask your health care provider how often you should have your eyes checked.  Personal lifestyle choices, including:  Daily care of your teeth and gums.  Regular physical activity.  Eating a healthy diet.  Avoiding tobacco and drug use.  Limiting alcohol use.  Practicing safe sex.  Taking low doses of aspirin every day.  Taking vitamin and mineral supplements as recommended by your health care provider. What happens during an annual well check? The services and screenings done by your health care provider during your annual well check will depend on your age, overall health, lifestyle risk factors, and family history of disease. Counseling  Your health care provider may ask you questions about your:  Alcohol use.  Tobacco use.  Drug use.  Emotional well-being.  Home and relationship well-being.  Sexual activity.  Eating habits.  History of falls.  Memory and ability to understand (cognition).  Work and work Statistician. Screening  You may have the following tests or measurements:  Height, weight, and BMI.  Blood pressure.  Lipid and cholesterol levels. These may be checked every 5 years, or more frequently if you are over 44 years old.  Skin check.  Lung cancer screening. You may have this screening every year starting at age 36 if you have a 30-pack-year history of smoking and currently smoke or have quit within the past 15 years.  Fecal occult blood test (FOBT) of the stool. You may have this test every year starting at age 75.  Flexible sigmoidoscopy or  colonoscopy. You may have a sigmoidoscopy every 5 years or a colonoscopy every 10 years starting at age 44.  Prostate cancer screening. Recommendations will vary depending on your family history and other risks.  Hepatitis C blood test.  Hepatitis B blood test.  Sexually transmitted disease (STD) testing.  Diabetes screening. This is done by checking your blood sugar  (glucose) after you have not eaten for a while (fasting). You may have this done every 1-3 years.  Abdominal aortic aneurysm (AAA) screening. You may need this if you are a current or former smoker.  Osteoporosis. You may be screened starting at age 47 if you are at high risk. Talk with your health care provider about your test results, treatment options, and if necessary, the need for more tests. Vaccines  Your health care provider may recommend certain vaccines, such as:  Influenza vaccine. This is recommended every year.  Tetanus, diphtheria, and acellular pertussis (Tdap, Td) vaccine. You may need a Td booster every 10 years.  Zoster vaccine. You may need this after age 25.  Pneumococcal 13-valent conjugate (PCV13) vaccine. One dose is recommended after age 38.  Pneumococcal polysaccharide (PPSV23) vaccine. One dose is recommended after age 75. Talk to your health care provider about which screenings and vaccines you need and how often you need them. This information is not intended to replace advice given to you by your health care provider. Make sure you discuss any questions you have with your health care provider. Document Released: 08/05/2015 Document Revised: 03/28/2016 Document Reviewed: 05/10/2015 Elsevier Interactive Patient Education  2017 [place] Prevention in the Home Falls can cause injuries. They can happen to people of all ages. There are many things you can do to make your home safe and to help prevent falls. What can I do on the outside of my home?  Regularly fix the edges of walkways and driveways and fix any cracks.  Remove anything that might make you trip as you walk through a door, such as a raised step or threshold.  Trim any bushes or trees on the path to your home.  Use bright outdoor lighting.  Clear any walking paths of anything that might make someone trip, such as rocks or tools.  Regularly check to see if handrails are loose or  broken. Make sure that both sides of any steps have handrails.  Any raised decks and porches should have guardrails on the edges.  Have any leaves, snow, or ice cleared regularly.  Use sand or salt on walking paths during winter.  Clean up any spills in your garage right away. This includes oil or grease spills. What can I do in the bathroom?  Use night lights.  Install grab bars by the toilet and in the tub and shower. Do not use towel bars as grab bars.  Use non-skid mats or decals in the tub or shower.  If you need to sit down in the shower, use a plastic, non-slip stool.  Keep the floor dry. Clean up any water that spills on the floor as soon as it happens.  Remove soap buildup in the tub or shower regularly.  Attach bath mats securely with double-sided non-slip rug tape.  Do not have throw rugs and other things on the floor that can make you trip. What can I do in the bedroom?  Use night lights.  Make sure that you have a light by your bed that is easy to reach.  Do not use any  sheets or blankets that are too big for your bed. They should not hang down onto the floor.  Have a firm chair that has side arms. You can use this for support while you get dressed.  Do not have throw rugs and other things on the floor that can make you trip. What can I do in the kitchen?  Clean up any spills right away.  Avoid walking on wet floors.  Keep items that you use a lot in easy-to-reach places.  If you need to reach something above you, use a strong step stool that has a grab bar.  Keep electrical cords out of the way.  Do not use floor polish or wax that makes floors slippery. If you must use wax, use non-skid floor wax.  Do not have throw rugs and other things on the floor that can make you trip. What can I do with my stairs?  Do not leave any items on the stairs.  Make sure that there are handrails on both sides of the stairs and use them. Fix handrails that are  broken or loose. Make sure that handrails are as long as the stairways.  Check any carpeting to make sure that it is firmly attached to the stairs. Fix any carpet that is loose or worn.  Avoid having throw rugs at the top or bottom of the stairs. If you do have throw rugs, attach them to the floor with carpet tape.  Make sure that you have a light switch at the top of the stairs and the bottom of the stairs. If you do not have them, ask someone to add them for you. What else can I do to help prevent falls?  Wear shoes that:  Do not have high heels.  Have rubber bottoms.  Are comfortable and fit you well.  Are closed at the toe. Do not wear sandals.  If you use a stepladder:  Make sure that it is fully opened. Do not climb a closed stepladder.  Make sure that both sides of the stepladder are locked into place.  Ask someone to hold it for you, if possible.  Clearly mark and make sure that you can see:  Any grab bars or handrails.  First and last steps.  Where the edge of each step is.  Use tools that help you move around (mobility aids) if they are needed. These include:  Canes.  Walkers.  Scooters.  Crutches.  Turn on the lights when you go into a dark area. Replace any light bulbs as soon as they burn out.  Set up your furniture so you have a clear path. Avoid moving your furniture around.  If any of your floors are uneven, fix them.  If there are any pets around you, be aware of where they are.  Review your medicines with your doctor. Some medicines can make you feel dizzy. This can increase your chance of falling. Ask your doctor what other things that you can do to help prevent falls. This information is not intended to replace advice given to you by your health care provider. Make sure you discuss any questions you have with your health care provider. Document Released: 05/05/2009 Document Revised: 12/15/2015 Document Reviewed: 08/13/2014 Elsevier  Interactive Patient Education  2017 [employer].

## 2020-04-30 ENCOUNTER — Other Ambulatory Visit: Payer: Self-pay | Admitting: Family Medicine

## 2020-05-06 ENCOUNTER — Other Ambulatory Visit: Payer: Self-pay | Admitting: Family Medicine

## 2020-05-06 ENCOUNTER — Ambulatory Visit: Payer: Medicare HMO | Admitting: Family Medicine

## 2020-05-06 DIAGNOSIS — N400 Enlarged prostate without lower urinary tract symptoms: Secondary | ICD-10-CM

## 2020-05-06 DIAGNOSIS — I10 Essential (primary) hypertension: Secondary | ICD-10-CM

## 2020-05-11 ENCOUNTER — Ambulatory Visit: Payer: Medicare HMO | Admitting: Family Medicine

## 2020-05-13 ENCOUNTER — Other Ambulatory Visit: Payer: Self-pay | Admitting: Dermatology

## 2020-05-30 ENCOUNTER — Other Ambulatory Visit: Payer: Self-pay | Admitting: Family Medicine

## 2020-05-30 DIAGNOSIS — N400 Enlarged prostate without lower urinary tract symptoms: Secondary | ICD-10-CM

## 2020-06-08 ENCOUNTER — Ambulatory Visit (INDEPENDENT_AMBULATORY_CARE_PROVIDER_SITE_OTHER): Payer: Medicare HMO | Admitting: Family Medicine

## 2020-06-08 ENCOUNTER — Other Ambulatory Visit: Payer: Self-pay

## 2020-06-08 ENCOUNTER — Encounter: Payer: Self-pay | Admitting: Family Medicine

## 2020-06-08 VITALS — BP 140/80 | HR 87 | Temp 97.9°F | Ht 69.0 in | Wt 246.6 lb

## 2020-06-08 DIAGNOSIS — N400 Enlarged prostate without lower urinary tract symptoms: Secondary | ICD-10-CM | POA: Diagnosis not present

## 2020-06-08 DIAGNOSIS — I10 Essential (primary) hypertension: Secondary | ICD-10-CM

## 2020-06-08 DIAGNOSIS — Z23 Encounter for immunization: Secondary | ICD-10-CM

## 2020-06-08 DIAGNOSIS — C61 Malignant neoplasm of prostate: Secondary | ICD-10-CM | POA: Diagnosis not present

## 2020-06-08 MED ORDER — HYDROCHLOROTHIAZIDE 25 MG PO TABS: 25.0000 mg | ORAL_TABLET | Freq: Every day | ORAL | 1 refills | Status: DC

## 2020-06-08 NOTE — Assessment & Plan Note (Signed)
He will continue to see urology. 

## 2020-06-08 NOTE — Patient Instructions (Signed)
Nice to see you. We will increase your HCTZ to 25 mg once daily.  We will have you return in 1 month for labs and a BP check.

## 2020-06-08 NOTE — Progress Notes (Signed)
  [name], MD Phone: [phone]  Jeffrey Romero is a 69 y.o. male who presents today for f/u.  HYPERTENSION  Disease Monitoring  Home BP Monitoring 130s/70s Chest pain- no    Dyspnea- no Medications  Compliance-  Taking HCTZ 12.5 mg daily.   Edema- no  BPH: Strain- no Flow- good Nocturia- 0-1x Emptying bladder- yes Medication- flomax has been beneficial Follows with urology q6 months with history brachytherapy for prostate cancer.     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    Appearance: He is not diaphoretic.  Cardiovascular:     Rate and Rhythm: Normal rate and regular rhythm.     Heart sounds: Normal heart sounds.  Pulmonary:     Effort: Pulmonary effort is normal.     Breath sounds: Normal breath sounds.  Musculoskeletal:     Right lower leg: No edema.     Left lower leg: No edema.  Skin:    General: Skin is warm and dry.  Neurological:     Mental Status: He is alert.      Assessment/Plan: Please see individual problem list.  Problem List Items Addressed This Visit    BPH (benign prostatic hyperplasia)    Well-controlled.  He will continue Flomax 0.4 mg once daily.      HTN (hypertension) - Primary    Above goal. We will increase his HCTZ to 25 mg daily.  He will have lab work in 1 month.  Nurse BP check at that time as well.      Relevant  Medications   hydrochlorothiazide (HYDRODIURIL) 25 MG tablet   Other Relevant Orders   Basic Metabolic Panel (BMET)   Malignant neoplasm of prostate [facility])    He will continue to see urology.       Other Visit Diagnoses    Need for immunization against influenza       Relevant Orders   Flu Vaccine QUAD High Dose(Fluad) (Completed)       Health Maintenance: I encouraged him to get the COVID-19 booster.  Discussed safety and efficacy.   This visit occurred during the SARS-CoV-2 public health emergency.  Safety protocols were in place, including screening questions prior to the visit, additional usage of staff PPE, and extensive cleaning of exam room while observing appropriate contact time as indicated for disinfecting solutions.    [name], MD [place]

## 2020-06-08 NOTE — Assessment & Plan Note (Signed)
Well-controlled.  He will continue Flomax 0.4 mg once daily.

## 2020-06-08 NOTE — Assessment & Plan Note (Signed)
Above goal. We will increase his HCTZ to 25 mg daily.  He will have lab work in 1 month.  Nurse BP check at that time as well.

## 2020-06-10 NOTE — Addendum Note (Signed)
Addended by: Tor Netters I on: 06/10/2020 03:48 PM   Modules accepted: Orders

## 2020-06-21 ENCOUNTER — Other Ambulatory Visit: Payer: Self-pay | Admitting: Family Medicine

## 2020-06-21 DIAGNOSIS — N400 Enlarged prostate without lower urinary tract symptoms: Secondary | ICD-10-CM

## 2020-07-06 ENCOUNTER — Ambulatory Visit (INDEPENDENT_AMBULATORY_CARE_PROVIDER_SITE_OTHER): Payer: Medicare HMO

## 2020-07-06 ENCOUNTER — Other Ambulatory Visit: Payer: Self-pay

## 2020-07-06 DIAGNOSIS — I10 Essential (primary) hypertension: Secondary | ICD-10-CM

## 2020-07-06 LAB — BASIC METABOLIC PANEL
BUN: 18 mg/dL (ref 6–23)
CO2: 31 mEq/L (ref 19–32)
Calcium: 9.5 mg/dL (ref 8.4–10.5)
Chloride: 100 mEq/L (ref 96–112)
Creatinine, Ser: 0.79 mg/dL (ref 0.40–1.50)
GFR: 90.3 mL/min (ref >60.00)
Glucose, Bld: 94 mg/dL (ref 70–99)
Potassium: 3.6 mEq/L (ref 3.5–5.1)
Sodium: 139 mEq/L (ref 135–145)

## 2020-07-06 NOTE — Progress Notes (Signed)
Patient is here for a BP check due to bp being high at last visit, as per patient.  Currently patients BP is 140/80 and BPM is 76.  Patient has no complaints of headaches, blurry vision, chest pain, arm pain, light headedness, dizziness, and nor jaw pain. Please see previous note for order.

## 2020-07-07 ENCOUNTER — Ambulatory Visit: Payer: Medicare HMO

## 2020-07-07 NOTE — Addendum Note (Signed)
Addended by: [name] on: 07/07/2020 11:33 AM   Modules accepted: Orders

## 2020-07-14 ENCOUNTER — Other Ambulatory Visit: Payer: Self-pay | Admitting: Family Medicine

## 2020-07-14 DIAGNOSIS — N400 Enlarged prostate without lower urinary tract symptoms: Secondary | ICD-10-CM

## 2020-07-21 DIAGNOSIS — C61 Malignant neoplasm of prostate: Secondary | ICD-10-CM | POA: Diagnosis not present

## 2020-07-21 LAB — PSA: PSA: 0.074

## 2020-07-26 LAB — PSA: PSA: 0.074

## 2020-07-28 ENCOUNTER — Encounter: Payer: Self-pay | Admitting: Family Medicine

## 2020-07-28 ENCOUNTER — Telehealth: Payer: Self-pay

## 2020-07-28 NOTE — Telephone Encounter (Signed)
Result noted. Patient will continue to follow with urology.

## 2020-08-09 ENCOUNTER — Other Ambulatory Visit: Payer: Self-pay | Admitting: Family Medicine

## 2020-08-09 DIAGNOSIS — N400 Enlarged prostate without lower urinary tract symptoms: Secondary | ICD-10-CM

## 2020-08-31 ENCOUNTER — Other Ambulatory Visit: Payer: Self-pay | Admitting: Family Medicine

## 2020-08-31 DIAGNOSIS — N400 Enlarged prostate without lower urinary tract symptoms: Secondary | ICD-10-CM

## 2020-09-09 ENCOUNTER — Ambulatory Visit: Payer: Medicare HMO | Admitting: Family Medicine

## 2020-09-23 ENCOUNTER — Other Ambulatory Visit: Payer: Self-pay | Admitting: Family Medicine

## 2020-09-23 DIAGNOSIS — N400 Enlarged prostate without lower urinary tract symptoms: Secondary | ICD-10-CM

## 2020-10-18 ENCOUNTER — Other Ambulatory Visit: Payer: Self-pay | Admitting: Family Medicine

## 2020-10-18 DIAGNOSIS — N400 Enlarged prostate without lower urinary tract symptoms: Secondary | ICD-10-CM

## 2020-10-19 ENCOUNTER — Ambulatory Visit: Payer: Medicare HMO | Admitting: Family Medicine

## 2020-10-23 ENCOUNTER — Other Ambulatory Visit: Payer: Self-pay | Admitting: Family Medicine

## 2020-11-03 ENCOUNTER — Encounter: Payer: Self-pay | Admitting: Family Medicine

## 2020-11-10 ENCOUNTER — Other Ambulatory Visit: Payer: Self-pay | Admitting: Family Medicine

## 2020-11-10 DIAGNOSIS — N400 Enlarged prostate without lower urinary tract symptoms: Secondary | ICD-10-CM

## 2020-11-14 ENCOUNTER — Ambulatory Visit: Payer: Medicare HMO | Admitting: Family Medicine

## 2020-11-28 ENCOUNTER — Other Ambulatory Visit: Payer: Self-pay | Admitting: Family Medicine

## 2020-11-28 DIAGNOSIS — I10 Essential (primary) hypertension: Secondary | ICD-10-CM

## 2020-12-03 ENCOUNTER — Other Ambulatory Visit: Payer: Self-pay | Admitting: Family Medicine

## 2020-12-03 DIAGNOSIS — N400 Enlarged prostate without lower urinary tract symptoms: Secondary | ICD-10-CM

## 2020-12-27 ENCOUNTER — Other Ambulatory Visit: Payer: Self-pay | Admitting: Family Medicine

## 2020-12-27 DIAGNOSIS — N400 Enlarged prostate without lower urinary tract symptoms: Secondary | ICD-10-CM

## 2021-01-19 ENCOUNTER — Other Ambulatory Visit: Payer: Self-pay | Admitting: Family Medicine

## 2021-01-19 DIAGNOSIS — N400 Enlarged prostate without lower urinary tract symptoms: Secondary | ICD-10-CM

## 2021-01-23 ENCOUNTER — Other Ambulatory Visit: Payer: Self-pay | Admitting: Dermatology

## 2021-01-25 ENCOUNTER — Encounter: Payer: Self-pay | Admitting: Internal Medicine

## 2021-01-25 ENCOUNTER — Other Ambulatory Visit: Payer: Self-pay | Admitting: Internal Medicine

## 2021-01-25 ENCOUNTER — Encounter: Payer: Self-pay | Admitting: Family Medicine

## 2021-01-25 DIAGNOSIS — U071 COVID-19: Secondary | ICD-10-CM | POA: Insufficient documentation

## 2021-01-25 MED ORDER — MOLNUPIRAVIR EUA 200MG CAPSULE: 4.0000 | ORAL_CAPSULE | Freq: Two times a day (BID) | ORAL | 0 refills | Status: AC

## 2021-01-25 MED ORDER — BENZONATATE 200 MG PO CAPS: 200.0000 mg | ORAL_CAPSULE | Freq: Three times a day (TID) | ORAL | 1 refills | Status: DC | PRN

## 2021-02-02 LAB — FECAL OCCULT BLOOD, GUAIAC: Fecal Occult Blood: NEGATIVE

## 2021-02-11 ENCOUNTER — Other Ambulatory Visit: Payer: Self-pay | Admitting: Family Medicine

## 2021-02-11 DIAGNOSIS — N400 Enlarged prostate without lower urinary tract symptoms: Secondary | ICD-10-CM

## 2021-03-07 ENCOUNTER — Other Ambulatory Visit: Payer: Self-pay | Admitting: Family Medicine

## 2021-03-07 DIAGNOSIS — N400 Enlarged prostate without lower urinary tract symptoms: Secondary | ICD-10-CM

## 2021-03-16 DIAGNOSIS — C61 Malignant neoplasm of prostate: Secondary | ICD-10-CM | POA: Diagnosis not present

## 2021-03-23 DIAGNOSIS — C61 Malignant neoplasm of prostate: Secondary | ICD-10-CM | POA: Diagnosis not present

## 2021-03-30 ENCOUNTER — Other Ambulatory Visit: Payer: Self-pay | Admitting: Family Medicine

## 2021-03-30 DIAGNOSIS — N400 Enlarged prostate without lower urinary tract symptoms: Secondary | ICD-10-CM

## 2021-04-16 ENCOUNTER — Other Ambulatory Visit: Payer: Self-pay | Admitting: Family Medicine

## 2021-04-20 ENCOUNTER — Ambulatory Visit (INDEPENDENT_AMBULATORY_CARE_PROVIDER_SITE_OTHER): Payer: Medicare HMO

## 2021-04-20 VITALS — Ht 69.0 in | Wt 246.0 lb

## 2021-04-20 DIAGNOSIS — Z0000 Encounter for general adult medical examination without abnormal findings: Secondary | ICD-10-CM

## 2021-04-20 NOTE — Patient Instructions (Addendum)
  Jeffrey Romero , Thank you for taking time to come for your Medicare Wellness Visit. I appreciate your ongoing commitment to your health goals. Please review the following plan we discussed and let me know if I can assist you in the future.   These are the goals we discussed:  Goals      Follow up with Primary Care Provider     As needed        This is a list of the screening recommended for you and due dates:  Health Maintenance  Topic Date Due   COVID-19 Vaccine (4 - Booster for Pfizer series) 05/06/2021*   Flu Shot  10/20/2021*   Cologuard (Stool DNA test)  04/20/2022*   Stool Blood Test  02/02/2022   Tetanus Vaccine  12/22/2027   Hepatitis C Screening: USPSTF Recommendation to screen - Ages 18-79 yo.  Completed   Zoster (Shingles) Vaccine  Completed   HPV Vaccine  Aged Out  *Topic was postponed. The date shown is not the original due date.

## 2021-04-20 NOTE — Progress Notes (Signed)
Subjective:   Jeffrey Romero is a 70 y.o. male who presents for Medicare Annual/Subsequent preventive examination.  Review of Systems    No ROS.  Medicare Wellness Virtual Visit.  Visual/audio telehealth visit, UTA vital signs.   See social history for additional risk factors.   Cardiac Risk Factors include: advanced age (>40men, >73 women);male gender;hypertension     Objective:    Today's Vitals   04/20/21 1332  Weight: 246 lb (111.6 kg)  Height: 5\' 9"  (1.753 m)   Body mass index is 36.33 kg/m.  Advanced Directives 04/20/2021 04/19/2020 04/17/2019 08/25/2018 07/06/2017 07/01/2017  Does Patient Have a Medical Advance Directive? Yes Yes Yes Yes No No  Type of Paramedic of [place];Living will [place];Living will [place];Living will Living will;Healthcare Power of Attorney - -  Does patient want to make changes to medical advance directive? No - Patient declined No - Patient declined No - Patient declined - - -  Copy of [place] in Chart? No - copy requested No - copy requested No - copy requested No - copy requested - -  Would patient like information on creating a medical advance directive? - - - - No - Patient declined No - Patient declined    Current Medications (verified) Outpatient Encounter Medications as of 04/20/2021  Medication Sig   hydrochlorothiazide (HYDRODIURIL) 25 MG tablet TAKE 1 TABLET BY MOUTH EVERY DAY   ibuprofen (ADVIL,MOTRIN) 200 MG tablet Take 200 mg by mouth as needed.    Omega-3 Fatty Acids (FISH OIL) 1200 MG CAPS Take 1,200 mg by mouth.   simvastatin (ZOCOR) 40 MG tablet TAKE 1 TABLET BY MOUTH EVERY DAY   tamsulosin (FLOMAX) 0.4 MG CAPS capsule TAKE 1 CAPSULE BY MOUTH EVERY DAY   triamcinolone cream (KENALOG) 0.1 % APPLY A SMALL AMOUNT TO AFFECTED AREA AS DIRECTED TWICE A DAY FOR 2 WEEKS THEN DECREASE TO DAILY 5 TIMES PER WEEK. AVOID FACE, GROIN, UNDERARMS.    [DISCONTINUED] benzonatate (TESSALON) 200 MG capsule Take 1 capsule (200 mg total) by mouth 3 (three) times daily as needed for cough.   No facility-administered encounter medications on file as of 04/20/2021.    Allergies (verified) Patient has no known allergies.   History: Past Medical History:  Diagnosis Date   BPH (benign prostatic hyperplasia) 03/01/2016   EXTERNAL HEMORRHOIDS WITHOUT MENTION COMP    HYPERLIPIDEMIA    HYPERTENSION    Osteoarthritis of knee    Prostate cancer [facility])    Past Surgical History:  Procedure Laterality Date   CYSTOSCOPY N/A 08/25/2018   Procedure: CYSTOSCOPY FLEXIBLE;  Surgeon: Cleon Gustin, MD;  Location: [facility];  Service: Urology;  Laterality: N/A;  NO SEEDS FOUND IN BLADDER   Neck fusion  05 & 07   PROSTATE BIOPSY     RADIOACTIVE SEED IMPLANT N/A 08/25/2018   Procedure: RADIOACTIVE SEED IMPLANT/BRACHYTHERAPY IMPLANT;  Surgeon: Cleon Gustin, MD;  Location[facility];  Service: Urology;  Laterality: N/A;   42 SEEDS FOUND IN BLADDER   right knee surgery  2006   SPACE OAR INSTILLATION N/A 08/25/2018   Procedure: SPACE OAR INSTILLATION;  Surgeon: Cleon Gustin, MD;  Location: [facility];  Service: Urology;  Laterality: N/A;   TONSILLECTOMY AND ADENOIDECTOMY     TOTAL KNEE ARTHROPLASTY  08/2010   bilateral - olin   Family History  Adopted: Yes  Family history unknown: Yes  Social History   Socioeconomic History   Marital status: Single    Spouse name: Not on file   Number of children: 1   Years of education: Not on file   Highest education level: Not on file  Occupational History   Not on file  Tobacco Use   Smoking status: Never   Smokeless tobacco: Never   Tobacco comments:    partially retired - computer work; 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  Vaping Use   Vaping Use: Never used  Substance and Sexual Activity   Alcohol use: Yes    Alcohol/week: 0.0 standard drinks     Comment: Occasionally wine or beer    Drug use: No   Sexual activity: Not Currently  Other Topics Concern   Not on file  Social History Narrative   Retired    Lives by himself    Pets: 1 dog inside    Caffeine- 1 cup coffee    Right handed    Enjoys yard work, Psychologist, occupational of Radio broadcast assistant Strain: Low Risk    Difficulty of Paying Living Expenses: Not hard at all  Food Insecurity: No Food Insecurity   Worried About Charity fundraiser in the Last Year: Never true   Arboriculturist in the Last Year: Never true  Transportation Needs: No Transportation Needs   Lack of Transportation (Medical): No   Lack of Transportation (Non-Medical): No  Physical Activity: Sufficiently Active   Days of Exercise per Week: 7 days   Minutes of Exercise per Session: 40 min  Stress: No Stress Concern Present   Feeling of Stress : Not at all  Social Connections: Unknown   Frequency of Communication with Friends and Family: More than three times a week   Frequency of Social Gatherings with Friends and Family: Not on file   Attends Religious Services: Not on file   Active Member of Clubs or Organizations: Not on file   Attends Archivist Meetings: Not on file   Marital Status: Not on file    Tobacco Counseling Counseling given: Not Answered Tobacco comments: partially retired - computer work; 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 N  Vision? N  Difficulty concentrating or making decisions? N  Walking or climbing stairs? N  Dressing or bathing?  N  Doing errands, shopping? N  Preparing Food and eating ? N  Using the Toilet? N  In the past six months, have you accidently leaked urine? N  Do you have problems with loss of bowel control? N  Managing your Medications? N  Managing your Finances? N  Housekeeping or managing your Housekeeping? N  Some recent data might be hidden    Patient Care Team: [name], MD as PCP - General (Family Medicine) Paralee Cancel, MD (Orthopedic Surgery)  Indicate any recent Medical Services you may have received from other than [facility] providers in the past year (date may be approximate).     Assessment:   This is a routine  wellness examination for [place].  I connected with [place] today by telephone and verified that I am speaking with the correct person using two identifiers. Location patient: home Location provider: work Persons participating in the virtual visit: patient, Marine scientist.    I discussed the limitations, risks, security and privacy concerns of performing an evaluation and management service by telephone and the availability of in person appointments. The patient expressed understanding and verbally consented to this telephonic visit.    Interactive audio and video telecommunications were attempted between this provider and patient, however failed, due to patient having technical difficulties OR patient did not have access to video capability.  We continued and completed visit with audio only.  Some vital signs may be absent or patient reported.   Hearing/Vision screen Hearing Screening - Comments:: Patient is able to hear conversational tones without difficulty.  No issues reported. Vision Screening - Comments:: Wears corrective lenses They have seen their ophthalmologist in the last 12 months.    Dietary issues and exercise activities discussed: Current Exercise Habits: Home exercise routine, Type of exercise: walking, Time (Minutes): 30, Frequency (Times/Week): 5, Weekly  Exercise (Minutes/Week): 150, Intensity: Moderate Healthy diet  Good water intake   Goals Addressed             This Visit's Progress    Follow up with Primary Care Provider       As needed       Depression Screen [facility] 2/9 Scores 04/20/2021 04/19/2020 11/04/2019 04/17/2019 12/31/2018 11/23/2016 11/05/2012  PHQ - 2 Score 0 0 0 0 0 0 0  PHQ- 9 Score - - - - 0 0 -    Fall Risk Fall Risk  04/20/2021 04/19/2020 11/04/2019 04/17/2019 11/23/2016  Falls in the past year? 0 0 0 0 No  Number falls in past yr: 0 0 0 - -  Injury with Fall? 0 0 - - -  Follow up Falls evaluation completed Falls evaluation completed Falls evaluation completed - -    FALL RISK PREVENTION PERTAINING TO THE HOME: Adequate lighting in your home to reduce risk of falls? Yes   ASSISTIVE DEVICES UTILIZED TO PREVENT FALLS: Use of a cane, walker or w/c? No   TIMED UP AND GO: Was the test performed? No .   Cognitive Function:  Patient is alert and oriented x3.  6CIT Screen 04/17/2019  What Year? 0 points  What month? 0 points  What time? 0 points  Count back from 20 0 points  Months in reverse 0 points  Repeat phrase 0 points  Total Score 0    Immunizations Immunization History  Administered Date(s) Administered   Fluad Quad(high Dose 65+) 03/20/2019, 06/08/2020   Influenza, High Dose Seasonal PF 04/29/2017   Influenza-Unspecified 04/22/2012, 05/04/2016, 03/06/2018   PFIZER(Purple Top)SARS-COV-2 Vaccination 08/29/2019, 09/22/2019, 06/10/2020   Pneumococcal Conjugate-13 11/23/2016   Pneumococcal Polysaccharide-23 02/20/2018   Tdap 02/06/2011, 12/21/2017   Zoster Recombinat (Shingrix) 03/13/2018, 05/18/2018, 06/02/2018   Zoster, Live 02/06/2011   Health Maintenance Health Maintenance  Topic Date Due   COVID-19 Vaccine (4 - Booster for Senath series) 05/06/2021 (Originally 09/02/2020)   INFLUENZA VACCINE  10/20/2021 (Originally 02/20/2021)   Fecal DNA (Cologuard)  04/20/2022 (Originally 02/24/2021)   COLON  CANCER SCREENING ANNUAL FOBT  02/02/2022   TETANUS/TDAP  12/22/2027   Hepatitis C Screening  Completed   Zoster Vaccines- Shingrix  Completed   HPV VACCINES  Aged Out   Fecal Occult Blood, Guaiac- completed 02/02/21. Negative.   Lung Cancer Screening: (Low  Dose CT Chest recommended if Age 64-80 years, 30 pack-year currently smoking OR have quit w/in 15years.) does not qualify.   Vision Screening: Recommended annual ophthalmology exams for early detection of glaucoma and other disorders of the eye.  Dental Screening: Recommended annual dental exams for proper oral hygiene  Community Resource Referral / Chronic Care Management: CRR required this visit?  No   CCM required this visit?  No      Plan:   Keep all routine maintenance appointments.   I have personally reviewed and noted the following in the patient's chart:   Medical and social history Use of alcohol, tobacco or illicit drugs  Current medications and supplements including opioid prescriptions. Patient is not currently taking opioid prescriptions. Functional ability and status Nutritional status Physical activity Advanced directives List of other physicians Hospitalizations, surgeries, and ER visits in previous 12 months Vitals Screenings to include cognitive, depression, and falls Referrals and appointments  In addition, I have reviewed and discussed with patient certain preventive protocols, quality metrics, and best practice recommendations. A written personalized care plan for preventive services as well as general preventive health recommendations were provided to patient.     Varney Biles, LPN   7/87/1836

## 2021-04-27 ENCOUNTER — Other Ambulatory Visit: Payer: Self-pay | Admitting: Family Medicine

## 2021-04-27 DIAGNOSIS — N400 Enlarged prostate without lower urinary tract symptoms: Secondary | ICD-10-CM

## 2021-05-01 ENCOUNTER — Encounter: Payer: Self-pay | Admitting: Family Medicine

## 2021-05-01 NOTE — Telephone Encounter (Signed)
This patient needs a visit. It has been close to a year since he has been seen. It would be ok for the visit for his gout to be virtual if needed.

## 2021-05-02 NOTE — Telephone Encounter (Signed)
LVM for patient to call for an appointment, has not been seen in 1 year.  Jeffrey Romero,cma

## 2021-05-12 ENCOUNTER — Other Ambulatory Visit: Payer: Self-pay | Admitting: Family Medicine

## 2021-05-19 ENCOUNTER — Other Ambulatory Visit: Payer: Self-pay | Admitting: Family Medicine

## 2021-05-19 DIAGNOSIS — N400 Enlarged prostate without lower urinary tract symptoms: Secondary | ICD-10-CM

## 2021-05-19 DIAGNOSIS — I10 Essential (primary) hypertension: Secondary | ICD-10-CM

## 2021-05-23 ENCOUNTER — Encounter: Payer: Self-pay | Admitting: Family Medicine

## 2021-06-05 ENCOUNTER — Other Ambulatory Visit: Payer: Self-pay | Admitting: Family Medicine

## 2021-06-05 DIAGNOSIS — N400 Enlarged prostate without lower urinary tract symptoms: Secondary | ICD-10-CM

## 2021-06-23 ENCOUNTER — Other Ambulatory Visit: Payer: Self-pay

## 2021-06-23 ENCOUNTER — Telehealth (INDEPENDENT_AMBULATORY_CARE_PROVIDER_SITE_OTHER): Payer: Medicare HMO | Admitting: Family Medicine

## 2021-06-23 ENCOUNTER — Encounter: Payer: Self-pay | Admitting: Family Medicine

## 2021-06-23 VITALS — Ht 69.0 in | Wt 250.0 lb

## 2021-06-23 DIAGNOSIS — E7800 Pure hypercholesterolemia, unspecified: Secondary | ICD-10-CM | POA: Diagnosis not present

## 2021-06-23 DIAGNOSIS — Z8546 Personal history of malignant neoplasm of prostate: Secondary | ICD-10-CM | POA: Diagnosis not present

## 2021-06-23 DIAGNOSIS — M10071 Idiopathic gout, right ankle and foot: Secondary | ICD-10-CM

## 2021-06-23 DIAGNOSIS — Z6836 Body mass index (BMI) 36.0-36.9, adult: Secondary | ICD-10-CM | POA: Diagnosis not present

## 2021-06-23 DIAGNOSIS — I10 Essential (primary) hypertension: Secondary | ICD-10-CM

## 2021-06-23 NOTE — Assessment & Plan Note (Signed)
Rare issues with this.  We will check a uric acid with his lab work.  Discussed that he likely does not need a daily medication though I will send in colchicine for him to use as an abortive treatment if he has another gout flare.

## 2021-06-23 NOTE — Assessment & Plan Note (Signed)
Continue simvastatin 40 mg once daily.  Check lipid panel.

## 2021-06-23 NOTE — Progress Notes (Signed)
Virtual Visit via video Note  This visit type was conducted due to national recommendations for restrictions regarding the COVID-19 pandemic (e.g. social distancing).  This format is felt to be most appropriate for this patient at this time.  All issues noted in this document were discussed and addressed.  No physical exam was performed (except for noted visual exam findings with Video Visits).   I connected with Jeffrey Romero today at  9:00 AM EST by a video enabled telemedicine application or telephone and verified that I am speaking with the correct person using two identifiers. Location patient: home Location provider: home office Persons participating in the virtual visit: patient, provider  I discussed the limitations, risks, security and privacy concerns of performing an evaluation and management service by telephone and the availability of in person appointments. I also discussed with the patient that there may be a patient responsible charge related to this service. The patient expressed understanding and agreed to proceed.  Reason for visit: f/u.  HPI: HYPERTENSION Disease Monitoring Home BP Monitoring not checking Chest pain- no    Dyspnea- no Medications Compliance-  taking HCTZ.   Edema- no BMET    Component Value Date/Time   NA 139 07/06/2020 1403   K 3.6 07/06/2020 1403   CL 100 07/06/2020 1403   CO2 31 07/06/2020 1403   GLUCOSE 94 07/06/2020 1403   BUN 18 07/06/2020 1403   CREATININE 0.79 07/06/2020 1403   CALCIUM 9.5 07/06/2020 1403   GFRNONAA >60 08/18/2018 1040   GFRAA >60 08/18/2018 1040   HYPERLIPIDEMIA Symptoms Chest pain on exertion:  no  Medications: Compliance- taking simvastatin Right upper quadrant pain- no  Muscle aches- no Lipid Panel     Component Value Date/Time   CHOL 193 11/04/2019 1044   TRIG 111.0 11/04/2019 1044   HDL 67.90 11/04/2019 1044   CHOLHDL 3 11/04/2019 1044   VLDL 22.2 11/04/2019 1044   LDLCALC 103 (H) 11/04/2019 1044    LDLDIRECT 82.0 12/28/2019 0817   Gout: Patient notes a gout flare back in October in his right big toe.  Notes its only the second time in his life he has had that issue.  No specific triggering factors.  History of prostate cancer: He follows with urology.  He reports his most recent PSA was 0.03.   ROS: See pertinent positives and negatives per HPI.  Past Medical History:  Diagnosis Date   BPH (benign prostatic hyperplasia) 03/01/2016   EXTERNAL HEMORRHOIDS WITHOUT MENTION COMP    HYPERLIPIDEMIA    HYPERTENSION    Osteoarthritis of knee    Prostate cancer [facility])     Past Surgical History:  Procedure Laterality Date   CYSTOSCOPY N/A 08/25/2018   Procedure: CYSTOSCOPY FLEXIBLE;  Surgeon: Cleon Gustin, MD;  Location: [facility];  Service: Urology;  Laterality: N/A;  NO SEEDS FOUND IN BLADDER   Neck fusion  05 & 07   PROSTATE BIOPSY     RADIOACTIVE SEED IMPLANT N/A 08/25/2018   Procedure: RADIOACTIVE SEED IMPLANT/BRACHYTHERAPY IMPLANT;  Surgeon: Cleon Gustin, MD;  Location[facility];  Service: Urology;  Laterality: N/A;   14 SEEDS FOUND IN BLADDER   right knee surgery  2006   SPACE OAR INSTILLATION N/A 08/25/2018   Procedure: SPACE OAR INSTILLATION;  Surgeon: Cleon Gustin, MD;  [facility];  Service: Urology;  Laterality: N/A;   TONSILLECTOMY AND ADENOIDECTOMY     TOTAL KNEE ARTHROPLASTY  08/2010  bilateral - olin    Family History  Adopted: Yes  Family history unknown: Yes    SOCIAL HX: nonsmoker   Current Outpatient Medications:    hydrochlorothiazide (HYDRODIURIL) 25 MG tablet, TAKE 1 TABLET BY MOUTH EVERY DAY, Disp: 90 tablet, Rfl: 0   ibuprofen (ADVIL,MOTRIN) 200 MG tablet, Take 200 mg by mouth as needed. , Disp: , Rfl:    Omega-3 Fatty Acids (FISH OIL) 1200 MG CAPS, Take 1,200 mg by mouth., Disp: , Rfl:    simvastatin (ZOCOR) 40 MG tablet, TAKE 1 TABLET BY MOUTH EVERY DAY, Disp: 90 tablet, Rfl:  0   tamsulosin (FLOMAX) 0.4 MG CAPS capsule, TAKE 1 CAPSULE BY MOUTH EVERY DAY, Disp: 90 capsule, Rfl: 1   triamcinolone cream (KENALOG) 0.1 %, APPLY A SMALL AMOUNT TO AFFECTED AREA AS DIRECTED TWICE A DAY FOR 2 WEEKS THEN DECREASE TO DAILY 5 TIMES PER WEEK. AVOID FACE, GROIN, UNDERARMS., Disp: 454 g, Rfl: 1  EXAM:  VITALS per patient if applicable:  GENERAL: alert, oriented, appears well and in no acute distress  HEENT: atraumatic, conjunttiva clear, no obvious abnormalities on inspection of external nose and ears  NECK: normal movements of the head and neck  LUNGS: on inspection no signs of respiratory distress, breathing rate appears normal, no obvious gross SOB, gasping or wheezing  CV: no obvious cyanosis  MS: moves all visible extremities without noticeable abnormality  PSYCH/NEURO: pleasant and cooperative, no obvious depression or anxiety, speech and thought processing grossly intact  ASSESSMENT AND PLAN:  Discussed the following assessment and plan:  Problem List Items Addressed This Visit     Acute idiopathic gout of right foot    Rare issues with this.  We will check a uric acid with his lab work.  Discussed that he likely does not need a daily medication though I will send in colchicine for him to use as an abortive treatment if he has another gout flare.      Relevant Orders   Uric acid   CBC w/Diff   History of prostate cancer    He will continue to follow with his urologist.      HTN (hypertension) - Primary    Undetermined control.  We will have his blood pressure checked next week when he comes in for labs.  He will continue hydrochlorothiazide 25 mg once daily.      Hypercholesterolemia    Continue simvastatin 40 mg once daily.  Check lipid panel.      Relevant Orders   Lipid panel   Comp Met (CMET)   Obese   Relevant Orders   Hemoglobin A1c    Return in about 1 year (around 06/23/2022) for yearly follow-up.   I discussed the assessment and  treatment plan with the patient. The patient was provided an opportunity to ask questions and all were answered. The patient agreed with the plan and demonstrated an understanding of the instructions.   The patient was advised to call back or seek an in-person evaluation if the symptoms worsen or if the condition fails to improve as anticipated.    [name], MD

## 2021-06-23 NOTE — Assessment & Plan Note (Signed)
He will continue to follow with his urologist.

## 2021-06-23 NOTE — Assessment & Plan Note (Signed)
Undetermined control.  We will have his blood pressure checked next week when he comes in for labs.  He will continue hydrochlorothiazide 25 mg once daily.

## 2021-06-26 ENCOUNTER — Other Ambulatory Visit: Payer: Self-pay

## 2021-06-26 ENCOUNTER — Ambulatory Visit: Payer: Medicare HMO | Admitting: *Deleted

## 2021-06-26 ENCOUNTER — Other Ambulatory Visit (INDEPENDENT_AMBULATORY_CARE_PROVIDER_SITE_OTHER): Payer: Medicare HMO

## 2021-06-26 ENCOUNTER — Encounter: Payer: Self-pay | Admitting: *Deleted

## 2021-06-26 VITALS — BP 130/80 | HR 74 | Resp 16

## 2021-06-26 DIAGNOSIS — M10071 Idiopathic gout, right ankle and foot: Secondary | ICD-10-CM

## 2021-06-26 DIAGNOSIS — E7800 Pure hypercholesterolemia, unspecified: Secondary | ICD-10-CM | POA: Diagnosis not present

## 2021-06-26 DIAGNOSIS — Z6836 Body mass index (BMI) 36.0-36.9, adult: Secondary | ICD-10-CM

## 2021-06-26 DIAGNOSIS — I10 Essential (primary) hypertension: Secondary | ICD-10-CM

## 2021-06-26 LAB — LIPID PANEL
Cholesterol: 163 mg/dL (ref 0–200)
HDL: 51.4 mg/dL (ref >39.00)
LDL Cholesterol: 74 mg/dL (ref 0–99)
NonHDL: 111.51
Total CHOL/HDL Ratio: 3
Triglycerides: 187 mg/dL — ABNORMAL HIGH (ref 0.0–149.0)
VLDL: 37.4 mg/dL (ref 0.0–40.0)

## 2021-06-26 LAB — COMPREHENSIVE METABOLIC PANEL
ALT: 29 U/L (ref 0–53)
AST: 23 U/L (ref 0–37)
Albumin: 4.5 g/dL (ref 3.5–5.2)
Alkaline Phosphatase: 63 U/L (ref 39–117)
BUN: 18 mg/dL (ref 6–23)
CO2: 32 mEq/L (ref 19–32)
Calcium: 10 mg/dL (ref 8.4–10.5)
Chloride: 102 mEq/L (ref 96–112)
Creatinine, Ser: 0.85 mg/dL (ref 0.40–1.50)
GFR: 87.72 mL/min (ref >60.00)
Glucose, Bld: 97 mg/dL (ref 70–99)
Potassium: 4 mEq/L (ref 3.5–5.1)
Sodium: 142 mEq/L (ref 135–145)
Total Bilirubin: 0.7 mg/dL (ref 0.2–1.2)
Total Protein: 6.8 g/dL (ref 6.0–8.3)

## 2021-06-26 LAB — CBC WITH DIFFERENTIAL/PLATELET
Basophils Absolute: 0 10*3/uL (ref 0.0–0.1)
Basophils Relative: 0.3 % (ref 0.0–3.0)
Eosinophils Absolute: 0.2 10*3/uL (ref 0.0–0.7)
Eosinophils Relative: 4.4 % (ref 0.0–5.0)
HCT: 43.7 % (ref 39.0–52.0)
Hemoglobin: 15.2 g/dL (ref 13.0–17.0)
Lymphocytes Relative: 27 % (ref 12.0–46.0)
Lymphs Abs: 1.5 10*3/uL (ref 0.7–4.0)
MCHC: 34.7 g/dL (ref 30.0–36.0)
MCV: 90.2 fl (ref 78.0–100.0)
Monocytes Absolute: 0.7 10*3/uL (ref 0.1–1.0)
Monocytes Relative: 11.8 % (ref 3.0–12.0)
Neutro Abs: 3.2 10*3/uL (ref 1.4–7.7)
Neutrophils Relative %: 56.5 % (ref 43.0–77.0)
Platelets: 228 10*3/uL (ref 150.0–400.0)
RBC: 4.84 Mil/uL (ref 4.22–5.81)
RDW: 13.3 % (ref 11.5–15.5)
WBC: 5.6 10*3/uL (ref 4.0–10.5)

## 2021-06-26 LAB — URIC ACID: Uric Acid, Serum: 7.6 mg/dL (ref 4.0–7.8)

## 2021-06-26 LAB — HEMOGLOBIN A1C: Hgb A1c MFr Bld: 5.6 % (ref 4.6–6.5)

## 2021-06-26 NOTE — Progress Notes (Signed)
Patient here for nurse visit BP check per order from 06/23/2021.   Patient reports compliance with prescribed BP medications: yes, Patient takes HCTZ at different times each day, advised may want to take during waking hours due to diuretic effect.   Last dose of BP medication: 06/25/2021 around 5 PM ,  patient was allowed to rest prior to first reading and between first and second reading for 10 minutes.   BP Readings from Last 3 Encounters:  06/26/21 130/80  07/06/20 140/80  06/08/20 140/80   Pulse Readings from Last 3 Encounters:  06/26/21 74  07/06/20 76  06/08/20 87    Advised patient we would call if there were any new orders concerning blood pressure.  Patient verbalized understanding of instructions.   Kerin Salen, RN

## 2021-06-29 ENCOUNTER — Other Ambulatory Visit: Payer: Self-pay | Admitting: Family Medicine

## 2021-06-29 MED ORDER — COLCHICINE 0.6 MG PO TABS: ORAL_TABLET | ORAL | 0 refills | Status: DC

## 2021-07-07 ENCOUNTER — Other Ambulatory Visit: Payer: Self-pay | Admitting: Family Medicine

## 2021-07-29 ENCOUNTER — Other Ambulatory Visit: Payer: Self-pay | Admitting: Family Medicine

## 2021-08-09 ENCOUNTER — Other Ambulatory Visit: Payer: Self-pay | Admitting: Internal Medicine

## 2021-08-11 ENCOUNTER — Other Ambulatory Visit: Payer: Self-pay | Admitting: Family Medicine

## 2021-08-11 DIAGNOSIS — I10 Essential (primary) hypertension: Secondary | ICD-10-CM

## 2021-08-14 ENCOUNTER — Other Ambulatory Visit: Payer: Self-pay | Admitting: Family Medicine

## 2021-09-13 DIAGNOSIS — H01002 Unspecified blepharitis right lower eyelid: Secondary | ICD-10-CM | POA: Diagnosis not present

## 2021-09-26 DIAGNOSIS — C61 Malignant neoplasm of prostate: Secondary | ICD-10-CM | POA: Diagnosis not present

## 2021-09-26 LAB — PSA: PSA: 0.041

## 2021-11-19 ENCOUNTER — Other Ambulatory Visit: Payer: Self-pay | Admitting: Family Medicine

## 2021-11-19 DIAGNOSIS — I10 Essential (primary) hypertension: Secondary | ICD-10-CM

## 2021-11-20 ENCOUNTER — Telehealth: Payer: Self-pay

## 2021-11-27 ENCOUNTER — Other Ambulatory Visit: Payer: Self-pay | Admitting: Family Medicine

## 2021-11-27 DIAGNOSIS — N400 Enlarged prostate without lower urinary tract symptoms: Secondary | ICD-10-CM

## 2021-12-14 ENCOUNTER — Telehealth: Payer: Self-pay

## 2021-12-14 NOTE — Telephone Encounter (Signed)
LMTCB. Pt is due for chronic care management follow up. Please schedule when pt returns call.

## 2022-02-03 NOTE — Telephone Encounter (Signed)
Error. ng 

## 2022-02-16 ENCOUNTER — Other Ambulatory Visit: Payer: Self-pay | Admitting: Family Medicine

## 2022-02-16 DIAGNOSIS — I10 Essential (primary) hypertension: Secondary | ICD-10-CM

## 2022-03-13 ENCOUNTER — Other Ambulatory Visit: Payer: Self-pay | Admitting: Family Medicine

## 2022-03-13 DIAGNOSIS — I10 Essential (primary) hypertension: Secondary | ICD-10-CM

## 2022-03-27 DIAGNOSIS — C61 Malignant neoplasm of prostate: Secondary | ICD-10-CM | POA: Diagnosis not present

## 2022-04-02 DIAGNOSIS — H10503 Unspecified blepharoconjunctivitis, bilateral: Secondary | ICD-10-CM | POA: Diagnosis not present

## 2022-04-05 ENCOUNTER — Other Ambulatory Visit: Payer: Self-pay | Admitting: Family Medicine

## 2022-04-05 DIAGNOSIS — I10 Essential (primary) hypertension: Secondary | ICD-10-CM

## 2022-04-05 NOTE — Telephone Encounter (Signed)
Rx sent pt is aware 

## 2022-04-09 DIAGNOSIS — H10503 Unspecified blepharoconjunctivitis, bilateral: Secondary | ICD-10-CM | POA: Diagnosis not present

## 2022-04-23 DIAGNOSIS — C61 Malignant neoplasm of prostate: Secondary | ICD-10-CM | POA: Diagnosis not present

## 2022-04-25 ENCOUNTER — Telehealth: Payer: Self-pay

## 2022-04-25 ENCOUNTER — Ambulatory Visit (INDEPENDENT_AMBULATORY_CARE_PROVIDER_SITE_OTHER): Payer: Medicare HMO

## 2022-04-25 ENCOUNTER — Encounter: Payer: Self-pay | Admitting: Family Medicine

## 2022-04-25 VITALS — Ht 69.0 in | Wt 250.0 lb

## 2022-04-25 DIAGNOSIS — Z0000 Encounter for general adult medical examination without abnormal findings: Secondary | ICD-10-CM | POA: Diagnosis not present

## 2022-04-25 NOTE — Patient Instructions (Addendum)
Jeffrey Romero , Thank you for taking time to come for your Medicare Wellness Visit. I appreciate your ongoing commitment to your health goals. Please review the following plan we discussed and let me know if I can assist you in the future.   These are the goals we discussed:  Goals      Follow up with Primary Care Provider     As needed.        This is a list of the screening recommended for you and due dates:  Health Maintenance  Topic Date Due   COVID-19 Vaccine (4 - Pfizer risk series) 05/11/2022*   Stool Blood Test  06/22/2022*   Cologuard (Stool DNA test)  06/22/2022*   Flu Shot  10/21/2022*   Tetanus Vaccine  12/22/2027   Pneumonia Vaccine  Completed   Hepatitis C Screening: USPSTF Recommendation to screen - Ages 18-79 yo.  Completed   Zoster (Shingles) Vaccine  Completed   HPV Vaccine  Aged Out  *Topic was postponed. The date shown is not the original due date.    Advanced directives: End of life planning; Advance aging; Advanced directives discussed.  Copy of current HCPOA/Living Will requested.    Conditions/risks identified: none new  Next appointment: Follow up in one year for your annual wellness visit.   Preventive Care 72 Years and Older, Male  Preventive care refers to lifestyle choices and visits with your health care provider that can promote health and wellness. What does preventive care include? A yearly physical exam. This is also called an annual well check. Dental exams once or twice a year. Routine eye exams. Ask your health care provider how often you should have your eyes checked. Personal lifestyle choices, including: Daily care of your teeth and gums. Regular physical activity. Eating a healthy diet. Avoiding tobacco and drug use. Limiting alcohol use. Practicing safe sex. Taking low doses of aspirin every day. Taking vitamin and mineral supplements as recommended by your health care provider. What happens during an annual well check? The  services and screenings done by your health care provider during your annual well check will depend on your age, overall health, lifestyle risk factors, and family history of disease. Counseling  Your health care provider may ask you questions about your: Alcohol use. Tobacco use. Drug use. Emotional well-being. Home and relationship well-being. Sexual activity. Eating habits. History of falls. Memory and ability to understand (cognition). Work and work Statistician. Screening  You may have the following tests or measurements: Height, weight, and BMI. Blood pressure. Lipid and cholesterol levels. These may be checked every 5 years, or more frequently if you are over 1 years old. Skin check. Lung cancer screening. You may have this screening every year starting at age 72 if you have a 30-pack-year history of smoking and currently smoke or have quit within the past 15 years. Fecal occult blood test (FOBT) of the stool. You may have this test every year starting at age 72. Flexible sigmoidoscopy or colonoscopy. You may have a sigmoidoscopy every 5 years or a colonoscopy every 10 years starting at age 72. Prostate cancer screening. Recommendations will vary depending on your family history and other risks. Hepatitis C blood test. Hepatitis B blood test. Sexually transmitted disease (STD) testing. Diabetes screening. This is done by checking your blood sugar (glucose) after you have not eaten for a while (fasting). You may have this done every 1-3 years. Abdominal aortic aneurysm (AAA) screening. You may need this if you are a  current or former smoker. Osteoporosis. You may be screened starting at age 72 if you are at high risk. Talk with your health care provider about your test results, treatment options, and if necessary, the need for more tests. Vaccines  Your health care provider may recommend certain vaccines, such as: Influenza vaccine. This is recommended every year. Tetanus,  diphtheria, and acellular pertussis (Tdap, Td) vaccine. You may need a Td booster every 10 years. Zoster vaccine. You may need this after age 72. Pneumococcal 13-valent conjugate (PCV13) vaccine. One dose is recommended after age 72. Pneumococcal polysaccharide (PPSV23) vaccine. One dose is recommended after age 72. Talk to your health care provider about which screenings and vaccines you need and how often you need them. This information is not intended to replace advice given to you by your health care provider. Make sure you discuss any questions you have with your health care provider. Document Released: 08/05/2015 Document Revised: 03/28/2016 Document Reviewed: 05/10/2015 Elsevier Interactive Patient Education  2017 [place] Prevention in the Home Falls can cause injuries. They can happen to people of all ages. There are many things you can do to make your home safe and to help prevent falls. What can I do on the outside of my home? Regularly fix the edges of walkways and driveways and fix any cracks. Remove anything that might make you trip as you walk through a door, such as a raised step or threshold. Trim any bushes or trees on the path to your home. Use bright outdoor lighting. Clear any walking paths of anything that might make someone trip, such as rocks or tools. Regularly check to see if handrails are loose or broken. Make sure that both sides of any steps have handrails. Any raised decks and porches should have guardrails on the edges. Have any leaves, snow, or ice cleared regularly. Use sand or salt on walking paths during winter. Clean up any spills in your garage right away. This includes oil or grease spills. What can I do in the bathroom? Use night lights. Install grab bars by the toilet and in the tub and shower. Do not use towel bars as grab bars. Use non-skid mats or decals in the tub or shower. If you need to sit down in the shower, use a plastic,  non-slip stool. Keep the floor dry. Clean up any water that spills on the floor as soon as it happens. Remove soap buildup in the tub or shower regularly. Attach bath mats securely with double-sided non-slip rug tape. Do not have throw rugs and other things on the floor that can make you trip. What can I do in the bedroom? Use night lights. Make sure that you have a light by your bed that is easy to reach. Do not use any sheets or blankets that are too big for your bed. They should not hang down onto the floor. Have a firm chair that has side arms. You can use this for support while you get dressed. Do not have throw rugs and other things on the floor that can make you trip. What can I do in the kitchen? Clean up any spills right away. Avoid walking on wet floors. Keep items that you use a lot in easy-to-reach places. If you need to reach something above you, use a strong step stool that has a grab bar. Keep electrical cords out of the way. Do not use floor polish or wax that makes floors slippery. If you must use  wax, use non-skid floor wax. Do not have throw rugs and other things on the floor that can make you trip. What can I do with my stairs? Do not leave any items on the stairs. Make sure that there are handrails on both sides of the stairs and use them. Fix handrails that are broken or loose. Make sure that handrails are as long as the stairways. Check any carpeting to make sure that it is firmly attached to the stairs. Fix any carpet that is loose or worn. Avoid having throw rugs at the top or bottom of the stairs. If you do have throw rugs, attach them to the floor with carpet tape. Make sure that you have a light switch at the top of the stairs and the bottom of the stairs. If you do not have them, ask someone to add them for you. What else can I do to help prevent falls? Wear shoes that: Do not have high heels. Have rubber bottoms. Are comfortable and fit you well. Are closed  at the toe. Do not wear sandals. If you use a stepladder: Make sure that it is fully opened. Do not climb a closed stepladder. Make sure that both sides of the stepladder are locked into place. Ask someone to hold it for you, if possible. Clearly mark and make sure that you can see: Any grab bars or handrails. First and last steps. Where the edge of each step is. Use tools that help you move around (mobility aids) if they are needed. These include: Canes. Walkers. Scooters. Crutches. Turn on the lights when you go into a dark area. Replace any light bulbs as soon as they burn out. Set up your furniture so you have a clear path. Avoid moving your furniture around. If any of your floors are uneven, fix them. If there are any pets around you, be aware of where they are. Review your medicines with your doctor. Some medicines can make you feel dizzy. This can increase your chance of falling. Ask your doctor what other things that you can do to help prevent falls. This information is not intended to replace advice given to you by your health care provider. Make sure you discuss any questions you have with your health care provider. Document Released: 05/05/2009 Document Revised: 12/15/2015 Document Reviewed: 08/13/2014 Elsevier Interactive Patient Education  2017 [employer].

## 2022-04-25 NOTE — Progress Notes (Signed)
Subjective:   Jeffrey Romero is a 71 y.o. male who presents for Medicare Annual/Subsequent preventive examination.  Review of Systems    No ROS.  Medicare Wellness Virtual Visit.  Visual/audio telehealth visit, UTA vital signs.   See social history for additional risk factors.   Cardiac Risk Factors include: advanced age (>58mn, >>5women);male gender     Objective:    Today's Vitals   04/25/22 1422  Weight: 250 lb (113.4 kg)  Height: '5\' 9"'$  (1.753 m)   Body mass index is 36.92 kg/m.     04/25/2022    2:21 PM 04/20/2021    2:53 PM 04/19/2020    1:13 PM 04/17/2019    1:11 PM 08/25/2018   11:09 AM 07/06/2017    8:40 AM 07/01/2017    9:54 AM  Advanced Directives  Does Patient Have a Medical Advance Directive? Yes Yes Yes Yes Yes No No  Type of AParamedicof AEdcouchLiving will HClinchcoLiving will HKramerLiving will HPaxtonLiving will Living will;Healthcare Power of Attorney    Does patient want to make changes to medical advance directive? No - Patient declined No - Patient declined No - Patient declined No - Patient declined     Copy of HBodein Chart? No - copy requested No - copy requested No - copy requested No - copy requested No - copy requested    Would patient like information on creating a medical advance directive?      No - Patient declined No - Patient declined    Current Medications (verified) Outpatient Encounter Medications as of 04/25/2022  Medication Sig   colchicine 0.6 MG tablet TAKE 1.2 MG (2 TABLETS) AT THE FIRST SIGN OF A GOUT FLARE, THEN TAKE 0.6 MG (1 TABLET) ONE HOUR LATER, ON DAY 2 AND AFTER TAKE 0.6 MG (1 TABLET) TWICE DAILY UNTIL 48 HOURS AFTER RESOLUTION OF SYMPTOMS.   hydrochlorothiazide (HYDRODIURIL) 25 MG tablet TAKE 1 TABLET BY MOUTH EVERY DAY   ibuprofen (ADVIL,MOTRIN) 200 MG tablet Take 200 mg by mouth as needed.    Omega-3 Fatty  Acids (FISH OIL) 1200 MG CAPS Take 1,200 mg by mouth.   simvastatin (ZOCOR) 40 MG tablet TAKE 1 TABLET BY MOUTH EVERY DAY   tamsulosin (FLOMAX) 0.4 MG CAPS capsule TAKE 1 CAPSULE BY MOUTH EVERY DAY   triamcinolone cream (KENALOG) 0.1 % APPLY A SMALL AMOUNT TO AFFECTED AREA AS DIRECTED TWICE A DAY FOR 2 WEEKS THEN DECREASE TO DAILY 5 TIMES PER WEEK. AVOID FACE, GROIN, UNDERARMS.   No facility-administered encounter medications on file as of 04/25/2022.    Allergies (verified) Patient has no known allergies.   History: Past Medical History:  Diagnosis Date   BPH (benign prostatic hyperplasia) 03/01/2016   EXTERNAL HEMORRHOIDS WITHOUT MENTION COMP    HYPERLIPIDEMIA    HYPERTENSION    Osteoarthritis of knee    Prostate cancer ([facility]    Past Surgical History:  Procedure Laterality Date   CYSTOSCOPY N/A 08/25/2018   Procedure: CYSTOSCOPY FLEXIBLE;  Surgeon: MCleon Gustin MD;  [facility]  Service: Urology;  Laterality: N/A;  NO SEEDS FOUND IN BLADDER   Neck fusion  05 & 07   PROSTATE BIOPSY     RADIOACTIVE SEED IMPLANT N/A 08/25/2018   Procedure: RADIOACTIVE SEED IMPLANT/BRACHYTHERAPY IMPLANT;  Surgeon: MCleon Gustin MD;  [facility]  Service: Urology;  Laterality: N/A;  [place]   right knee surgery  2006   SPACE OAR INSTILLATION N/A 08/25/2018   Procedure: SPACE OAR INSTILLATION;  Surgeon: Cleon Gustin, MD;  [facility];  Service: Urology;  Laterality: N/A;   TONSILLECTOMY AND ADENOIDECTOMY     TOTAL KNEE ARTHROPLASTY  08/2010   bilateral - olin   Family History  Adopted: Yes  Family history unknown: Yes   Social History   Socioeconomic History   Marital status: Single    Spouse name: Not on file   Number of children: 1   Years of education: Not on file   Highest education level: Not on file  Occupational History   Not on file  Tobacco Use   Smoking status: Never    Smokeless tobacco: Never   Tobacco comments:    partially retired - computer work; 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  Vaping Use   Vaping Use: Never used  Substance and Sexual Activity   Alcohol use: Yes    Alcohol/week: 0.0 standard drinks of alcohol    Comment: Occasionally wine or beer    Drug use: No   Sexual activity: Not Currently  Other Topics Concern   Not on file  Social History Narrative   Retired    Lives by himself    Pets: 1 dog inside    Caffeine- 1 cup coffee    Right handed    Enjoys yard work, Physiological scientist Strain: [place]  (04/25/2022)   Overall Financial Resource Strain (CARDIA)    Difficulty of Paying Living Expenses: Not hard at all  Food Insecurity: No Food Insecurity (04/25/2022)   Hunger Vital Sign    Worried About Running Out of Food in the Last Year: Never true    [place] in the Last Year: Never true  Transportation Needs: No Transportation Needs (04/25/2022)   PRAPARE - Hydrologist (Medical): No    Lack of Transportation (Non-Medical): No  Physical Activity: Sufficiently Active (04/25/2022)   Exercise Vital Sign    Days of Exercise per Week: 7 days    Minutes of Exercise per Session: 40 min  Stress: No Stress Concern Present (04/25/2022)   [place]    Feeling of Stress : Not at all  Social Connections: Unknown (04/25/2022)   Social Connection and Isolation Panel [NHANES]    Frequency of Communication with Friends and Family: Not on file    Frequency of Social Gatherings with Friends and Family: More than three times a week    Attends Religious Services: Not on file    Active Member of Clubs or Organizations: Not on file    Attends Archivist Meetings: Not on file    Marital Status: Not on file    Tobacco Counseling Counseling given: Not Answered Tobacco comments: partially retired -  computer work; 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   Clinical Intake:  Pre-visit preparation completed: Yes        Diabetes: No  How often do you need to have someone help you when you read instructions, pamphlets, or other written materials from your doctor or pharmacy?: 1 - Never   Interpreter Needed?: No      Activities of Daily Living    04/25/2022    2:25 PM  In your present state of health, do you have any difficulty  performing the following activities:  Hearing? 0  Vision? 0  Difficulty concentrating or making decisions? 0  Walking or climbing stairs? 0  Dressing or bathing? 0  Doing errands, shopping? 0  Preparing Food and eating ? N  Using the Toilet? N  In the past six months, have you accidently leaked urine? N  Do you have problems with loss of bowel control? N  Managing your Medications? N  Managing your Finances? N  Housekeeping or managing your Housekeeping? N    Patient Care Team: [name], MD as PCP - General (Family Medicine) Paralee Cancel, MD (Orthopedic Surgery)  Indicate any recent Medical Services you may have received from other than [facility] providers in the past year (date may be approximate).     Assessment:   This is a routine wellness examination for Tobenna.  I connected with  Annetta Maw on 04/25/22 by a audio enabled telemedicine application and verified that I am speaking with the correct person using two identifiers.  Patient Location: Home  Provider Location: Office/Clinic  I discussed the limitations of evaluation and management by telemedicine. The patient expressed understanding and agreed to proceed.   Hearing/Vision screen Hearing Screening - Comments:: Patient is able to hear conversational tones without difficulty. No issues reported. Vision Screening - Comments:: Followed by [facility] corrective lenses Cataracts extracted, bilateral They have seen their ophthalmologist in the last 12  months.   Dietary issues and exercise activities discussed: Current Exercise Habits: Home exercise routine, Type of exercise: walking, Time (Minutes): 45, Frequency (Times/Week): 7, Weekly Exercise (Minutes/Week): 315, Intensity: Moderate   Goals Addressed             This Visit's Progress    Follow up with Primary Care Provider       As needed.       Depression Screen    04/25/2022    2:25 PM 04/20/2021    2:03 PM 04/19/2020    1:14 PM 11/04/2019   10:25 AM 04/17/2019    1:21 PM 12/31/2018   10:17 AM 11/23/2016    9:17 AM  PHQ 2/9 Scores  PHQ - 2 Score 0 0 0 0 0 0 0  PHQ- 9 Score      0 0    Fall Risk    04/25/2022    2:24 PM 04/20/2021    2:58 PM 04/19/2020    1:13 PM 11/04/2019   10:24 AM 04/17/2019    1:21 PM  [place] in the past year? 0 0 0 0 0  Number falls in past yr: 0 0 0 0   Injury with Fall? 0 0 0    Risk for fall due to : No Fall Risks      Follow up Falls evaluation completed Falls evaluation completed Falls evaluation completed Falls evaluation completed     [place]: Home free of loose throw rugs in walkways, pet beds, electrical cords, etc? Yes  Adequate lighting in your home to reduce risk of falls? Yes   ASSISTIVE DEVICES UTILIZED TO PREVENT FALLS: Life alert? No  Use of a cane, walker or w/c? No   TIMED UP AND GO: Was the test performed? No .   Cognitive Function:  Patient is alert and oriented x3.       04/25/2022    2:51 PM 04/17/2019    1:14 PM  6CIT Screen  What Year? 0 points 0 points  What month? 0 points 0 points  What time? 0 points 0 points  Count back from 20  0 points  Months in reverse 0 points 0 points  Repeat phrase  0 points  Total Score  0 points    Immunizations Immunization History  Administered Date(s) Administered   Fluad Quad(high Dose 65+) 03/20/2019, 06/08/2020   Influenza Split 05/23/2021   Influenza, High Dose Seasonal PF 04/29/2017, 05/23/2021    Influenza-Unspecified 04/22/2012, 05/04/2016, 03/06/2018   PFIZER(Purple Top)SARS-COV-2 Vaccination 08/29/2019, 09/22/2019, 06/10/2020   Pneumococcal Conjugate-13 11/23/2016   Pneumococcal Polysaccharide-23 02/20/2018   Tdap 02/06/2011, 12/21/2017   Zoster Recombinat (Shingrix) 03/13/2018, 05/18/2018, 06/02/2018   Zoster, Live 02/06/2011   Flu Vaccine status: Due, Education has been provided regarding the importance of this vaccine. Advised may receive this vaccine at local pharmacy or Health Dept. Aware to provide a copy of the vaccination record if obtained from local pharmacy or Health Dept. Verbalized acceptance and understanding.  Covid-19 vaccine status: Completed vaccines  Screening Tests Health Maintenance  Topic Date Due   COVID-19 Vaccine (4 - Pfizer risk series) 05/11/2022 (Originally 08/05/2020)   COLON CANCER SCREENING ANNUAL FOBT  06/22/2022 (Originally 02/02/2022)   Fecal DNA (Cologuard)  06/22/2022 (Originally 02/24/2021)   INFLUENZA VACCINE  10/21/2022 (Originally 02/20/2022)   TETANUS/TDAP  12/22/2027   Pneumonia Vaccine 56+ Years old  Completed   Hepatitis C Screening  Completed   Zoster Vaccines- Shingrix  Completed   HPV VACCINES  Aged Out    Health Maintenance  There are no preventive care reminders to display for this patient.  Colon cancer screening- completed with another service. Agrees to update medical record upon receiving results.   Lung Cancer Screening: (Low Dose CT Chest recommended if Age 52-80 years, 30 pack-year currently smoking OR have quit w/in 15years.) does not qualify.   Hepatitis C Screening: Completed 2016.  Vision Screening: Recommended annual ophthalmology exams for early detection of glaucoma and other disorders of the eye.  Dental Screening: Recommended annual dental exams for proper oral hygiene  Community Resource Referral / Chronic Care Management: CRR required this visit?  No   CCM required this visit?  No      Plan:      I have personally reviewed and noted the following in the patient's chart:   Medical and social history Use of alcohol, tobacco or illicit drugs  Current medications and supplements including opioid prescriptions. Patient is not currently taking opioid prescriptions. Functional ability and status Nutritional status Physical activity Advanced directives List of other physicians Hospitalizations, surgeries, and ER visits in previous 12 months Vitals Screenings to include cognitive, depression, and falls Referrals and appointments  In addition, I have reviewed and discussed with patient certain preventive protocols, quality metrics, and best practice recommendations. A written personalized care plan for preventive services as well as general preventive health recommendations were provided to patient.     Varney Biles, LPN   [id number]/03/[id number]

## 2022-04-25 NOTE — Telephone Encounter (Signed)
error 

## 2022-04-28 ENCOUNTER — Other Ambulatory Visit: Payer: Self-pay | Admitting: Family Medicine

## 2022-04-28 DIAGNOSIS — I10 Essential (primary) hypertension: Secondary | ICD-10-CM

## 2022-04-30 ENCOUNTER — Other Ambulatory Visit: Payer: Self-pay

## 2022-04-30 DIAGNOSIS — I10 Essential (primary) hypertension: Secondary | ICD-10-CM

## 2022-05-01 NOTE — Telephone Encounter (Signed)
LMTCB to schedule appointment °

## 2022-05-01 NOTE — Telephone Encounter (Signed)
I called the patient and LVM informing him that he needed to call in and schedule a visit with the provider for any additional refill.  Alyxis Grippi,cma

## 2022-05-28 ENCOUNTER — Other Ambulatory Visit: Payer: Self-pay | Admitting: Family Medicine

## 2022-05-28 DIAGNOSIS — I10 Essential (primary) hypertension: Secondary | ICD-10-CM

## 2022-05-29 ENCOUNTER — Other Ambulatory Visit: Payer: Self-pay | Admitting: Family Medicine

## 2022-05-29 DIAGNOSIS — N400 Enlarged prostate without lower urinary tract symptoms: Secondary | ICD-10-CM

## 2022-06-07 DIAGNOSIS — H01021 Squamous blepharitis right upper eyelid: Secondary | ICD-10-CM | POA: Diagnosis not present

## 2022-06-07 DIAGNOSIS — H04123 Dry eye syndrome of bilateral lacrimal glands: Secondary | ICD-10-CM | POA: Diagnosis not present

## 2022-06-07 DIAGNOSIS — H01024 Squamous blepharitis left upper eyelid: Secondary | ICD-10-CM | POA: Diagnosis not present

## 2022-06-20 ENCOUNTER — Other Ambulatory Visit: Payer: Self-pay | Admitting: Family Medicine

## 2022-06-20 DIAGNOSIS — I10 Essential (primary) hypertension: Secondary | ICD-10-CM

## 2022-06-21 ENCOUNTER — Telehealth: Payer: Self-pay

## 2022-06-21 NOTE — Telephone Encounter (Signed)
Called pt in regards to his refill request for HCTZ.  He should not need refills as Dr Caryl Bis sent in 2 months on 05/28/22.  Pt can get scheduled with PCP for further refills.

## 2022-06-21 NOTE — Telephone Encounter (Signed)
He needs to be scheduled for an appointment in person to be able to get a 90 day supply as it has been very close to a year since he has been seen.

## 2022-06-22 ENCOUNTER — Other Ambulatory Visit: Payer: Self-pay

## 2022-06-22 DIAGNOSIS — I10 Essential (primary) hypertension: Secondary | ICD-10-CM

## 2022-06-22 MED ORDER — HYDROCHLOROTHIAZIDE 25 MG PO TABS: 25.0000 mg | ORAL_TABLET | Freq: Every day | ORAL | 1 refills | Status: DC

## 2022-07-12 DIAGNOSIS — H04123 Dry eye syndrome of bilateral lacrimal glands: Secondary | ICD-10-CM | POA: Diagnosis not present

## 2022-07-12 DIAGNOSIS — H01024 Squamous blepharitis left upper eyelid: Secondary | ICD-10-CM | POA: Diagnosis not present

## 2022-07-12 DIAGNOSIS — H01021 Squamous blepharitis right upper eyelid: Secondary | ICD-10-CM | POA: Diagnosis not present

## 2022-07-22 ENCOUNTER — Other Ambulatory Visit: Payer: Self-pay | Admitting: Family Medicine

## 2022-07-22 DIAGNOSIS — E7800 Pure hypercholesterolemia, unspecified: Secondary | ICD-10-CM

## 2022-08-18 ENCOUNTER — Other Ambulatory Visit: Payer: Self-pay | Admitting: Family Medicine

## 2022-08-18 DIAGNOSIS — E7800 Pure hypercholesterolemia, unspecified: Secondary | ICD-10-CM

## 2022-08-21 DIAGNOSIS — H52223 Regular astigmatism, bilateral: Secondary | ICD-10-CM | POA: Diagnosis not present

## 2022-08-21 DIAGNOSIS — Z0100 Encounter for examination of eyes and vision without abnormal findings: Secondary | ICD-10-CM | POA: Diagnosis not present

## 2022-08-21 DIAGNOSIS — H43813 Vitreous degeneration, bilateral: Secondary | ICD-10-CM | POA: Diagnosis not present

## 2022-09-12 ENCOUNTER — Other Ambulatory Visit: Payer: Self-pay | Admitting: Family Medicine

## 2022-09-12 DIAGNOSIS — E7800 Pure hypercholesterolemia, unspecified: Secondary | ICD-10-CM

## 2022-09-12 NOTE — Telephone Encounter (Signed)
LMTCB. Pt needs to schedule an appt with Dr. Caryl Bis, he has not been seen since 2022.

## 2022-09-13 NOTE — Telephone Encounter (Signed)
Left message to call the office back.

## 2022-09-24 ENCOUNTER — Ambulatory Visit: Payer: Medicare HMO | Admitting: Family Medicine

## 2022-09-24 DIAGNOSIS — N400 Enlarged prostate without lower urinary tract symptoms: Secondary | ICD-10-CM

## 2022-09-24 DIAGNOSIS — I10 Essential (primary) hypertension: Secondary | ICD-10-CM

## 2022-10-09 ENCOUNTER — Other Ambulatory Visit: Payer: Self-pay | Admitting: Family Medicine

## 2022-10-09 DIAGNOSIS — E7800 Pure hypercholesterolemia, unspecified: Secondary | ICD-10-CM

## 2022-10-27 ENCOUNTER — Other Ambulatory Visit: Payer: Self-pay | Admitting: Family Medicine

## 2022-10-27 DIAGNOSIS — E7800 Pure hypercholesterolemia, unspecified: Secondary | ICD-10-CM

## 2022-10-31 ENCOUNTER — Telehealth: Payer: Self-pay | Admitting: Family Medicine

## 2022-10-31 NOTE — Telephone Encounter (Addendum)
Left message patient need F/U appt with provider. Patient has not been seen since 12.2.2022.

## 2022-10-31 NOTE — Telephone Encounter (Signed)
Patient scheduled for follow up on 11/02/22.

## 2022-11-02 ENCOUNTER — Encounter: Payer: Self-pay | Admitting: Family Medicine

## 2022-11-02 ENCOUNTER — Ambulatory Visit (INDEPENDENT_AMBULATORY_CARE_PROVIDER_SITE_OTHER): Payer: Medicare HMO | Admitting: Family Medicine

## 2022-11-02 VITALS — BP 124/74 | HR 101 | Temp 97.8°F | Ht 69.0 in | Wt 253.4 lb

## 2022-11-02 DIAGNOSIS — M109 Gout, unspecified: Secondary | ICD-10-CM | POA: Diagnosis not present

## 2022-11-02 DIAGNOSIS — E7800 Pure hypercholesterolemia, unspecified: Secondary | ICD-10-CM

## 2022-11-02 DIAGNOSIS — I10 Essential (primary) hypertension: Secondary | ICD-10-CM | POA: Diagnosis not present

## 2022-11-02 DIAGNOSIS — Z1211 Encounter for screening for malignant neoplasm of colon: Secondary | ICD-10-CM | POA: Diagnosis not present

## 2022-11-02 NOTE — Assessment & Plan Note (Signed)
Asymptomatic.  He will monitor for recurrence. 

## 2022-11-02 NOTE — Progress Notes (Signed)
Marikay Alar, MD Phone: [phone]  Jeffrey Romero is a 72 y.o. male who presents today for f/u.  HYPERTENSION Disease Monitoring Home BP Monitoring not checking Chest pain- no    Dyspnea- no Medications Compliance-  taking HCTZ.   Edema- no BMET    Component Value Date/Time   NA 142 06/26/2021 0748   K 4.0 06/26/2021 0748   CL 102 06/26/2021 0748   CO2 32 06/26/2021 0748   GLUCOSE 97 06/26/2021 0748   BUN 18 06/26/2021 0748   CREATININE 0.85 06/26/2021 0748   CALCIUM 10.0 06/26/2021 0748   GFRNONAA >60 08/18/2018 1040   GFRAA >60 08/18/2018 1040   HYPERLIPIDEMIA Symptoms Chest pain on exertion:  no   Medications: Compliance- taking simvastatin Right upper quadrant pain- no  Muscle aches- no Lipid Panel     Component Value Date/Time   CHOL 163 06/26/2021 0748   TRIG 187.0 (H) 06/26/2021 0748   HDL 51.40 06/26/2021 0748   CHOLHDL 3 06/26/2021 0748   VLDL 37.4 06/26/2021 0748   LDLCALC 74 06/26/2021 0748   LDLDIRECT 82.0 12/28/2019 0817   Gout: no recent flares.   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 90 tablet 1   hydrochlorothiazide (HYDRODIURIL) 25 MG tablet TAKE 1 TABLET BY MOUTH EVERY DAY 90 tablet 1   hydrochlorothiazide (HYDRODIURIL) 25 MG tablet Take 1 tablet (25 mg total) by mouth daily. 30 tablet 1   ibuprofen (ADVIL,MOTRIN) 200 MG tablet Take 200 mg by mouth as needed.      Omega-3 Fatty Acids (FISH OIL) 1200 MG CAPS Take 1,200 mg by mouth.     simvastatin (ZOCOR) 40 MG tablet TAKE 1 TABLET BY MOUTH EVERY  DAY 30 tablet 0   tamsulosin (FLOMAX) 0.4 MG CAPS capsule TAKE 1 CAPSULE BY MOUTH EVERY DAY 90 capsule 1   triamcinolone cream (KENALOG) 0.1 % APPLY A SMALL AMOUNT TO AFFECTED AREA AS DIRECTED TWICE A DAY FOR 2 WEEKS THEN DECREASE TO DAILY 5 TIMES PER WEEK. AVOID FACE, GROIN, UNDERARMS. 454 g 1   No current facility-administered medications on file prior to visit.     ROS see history of present illness  Objective  Physical Exam Vitals:   11/02/22 1553  BP: 124/74  Pulse: (!) 101  Temp: 97.8 F (36.6 C)  SpO2: 97%    BP Readings from Last 3 Encounters:  11/02/22 124/74  06/26/21 130/80  07/06/20 140/80   Wt Readings from Last 3 Encounters:  11/02/22 253 lb 6.4 oz (114.9 kg)  04/25/22 250 lb (113.4 kg)  06/23/21 250 lb (113.4 kg)    Physical Exam Constitutional:      General: He is not in acute distress.    Appearance: He is not diaphoretic.  Cardiovascular:     Rate and Rhythm: Normal rate and regular rhythm.     Heart sounds: Normal heart sounds.  Pulmonary:     Effort: Pulmonary effort is normal.     Breath sounds: Normal breath sounds.  Musculoskeletal:  Right lower leg: No edema.     Left lower leg: No edema.  Skin:    General: Skin is warm and dry.  Neurological:     Mental Status: He is alert.      Assessment/Plan: Please see individual problem list.  Primary hypertension Assessment & Plan: Chronic issue.  Well-controlled.  Patient will continue HCTZ 25 mg daily.  Check labs.  Orders: -     Hemoglobin A1c  Hypercholesterolemia Assessment & Plan: Chronic issue.  Check lipid panel.  Continue simvastatin 40 mg daily.  Orders: -     Lipid panel -     Comprehensive metabolic panel  Gout, unspecified cause, unspecified chronicity, unspecified site Assessment & Plan: Asymptomatic.  He will monitor for recurrence.  Orders: -     Uric acid -     CBC with Differential/Platelet  Colon cancer screening -     Cologuard     Health  Maintenance: cologuard ordered.   Return in about 6 months (around 05/04/2023) for Hypertension.   Marikay Alar, MD [facility]

## 2022-11-02 NOTE — Assessment & Plan Note (Addendum)
Chronic issue.  Well-controlled.  Patient will continue HCTZ 25 mg daily.  Check labs.

## 2022-11-02 NOTE — Assessment & Plan Note (Signed)
Chronic issue.  Check lipid panel.  Continue simvastatin 40 mg daily.

## 2022-11-02 NOTE — Patient Instructions (Signed)
Nice to see you. Will get lab work today and contact you with the results. 

## 2022-11-03 LAB — CBC WITH DIFFERENTIAL/PLATELET
Absolute Monocytes: 779 cells/uL (ref 200–950)
Basophils Absolute: 33 cells/uL (ref 0–200)
Basophils Relative: 0.5 %
Eosinophils Absolute: 409 cells/uL (ref 15–500)
Eosinophils Relative: 6.2 %
HCT: 47 % (ref 38.5–50.0)
Hemoglobin: 16.3 g/dL (ref 13.2–17.1)
Lymphs Abs: 1465 cells/uL (ref 850–3900)
MCH: 31.6 pg (ref 27.0–33.0)
MCHC: 34.7 g/dL (ref 32.0–36.0)
MCV: 91.1 fL (ref 80.0–100.0)
MPV: 10.6 fL (ref 7.5–12.5)
Monocytes Relative: 11.8 %
Neutro Abs: 3914 cells/uL (ref 1500–7800)
Neutrophils Relative %: 59.3 %
Platelets: 267 10*3/uL (ref 140–400)
RBC: 5.16 10*6/uL (ref 4.20–5.80)
RDW: 13 % (ref 11.0–15.0)
Total Lymphocyte: 22.2 %
WBC: 6.6 10*3/uL (ref 3.8–10.8)

## 2022-11-03 LAB — COMPREHENSIVE METABOLIC PANEL
AG Ratio: 1.8 (calc) (ref 1.0–2.5)
ALT: 24 U/L (ref 9–46)
AST: 28 U/L (ref 10–35)
Albumin: 4.7 g/dL (ref 3.6–5.1)
Alkaline phosphatase (APISO): 71 U/L (ref 35–144)
BUN: 19 mg/dL (ref 7–25)
CO2: 27 mmol/L (ref 20–32)
Calcium: 10.3 mg/dL (ref 8.6–10.3)
Chloride: 100 mmol/L (ref 98–110)
Creat: 0.78 mg/dL (ref 0.70–1.28)
Globulin: 2.6 g/dL (calc) (ref 1.9–3.7)
Glucose, Bld: 127 mg/dL — ABNORMAL HIGH (ref 65–99)
Potassium: 3.7 mmol/L (ref 3.5–5.3)
Sodium: 139 mmol/L (ref 135–146)
Total Bilirubin: 0.6 mg/dL (ref 0.2–1.2)
Total Protein: 7.3 g/dL (ref 6.1–8.1)

## 2022-11-03 LAB — LIPID PANEL
Cholesterol: 192 mg/dL (ref <200)
HDL: 51 mg/dL (ref >=40)
Non-HDL Cholesterol (Calc): 141 mg/dL (calc) — ABNORMAL HIGH (ref <130)
Total CHOL/HDL Ratio: 3.8 (calc) (ref <5.0)
Triglycerides: 458 mg/dL — ABNORMAL HIGH (ref <150)

## 2022-11-03 LAB — HEMOGLOBIN A1C
Hgb A1c MFr Bld: 5.6 % of total Hgb (ref <5.7)
Mean Plasma Glucose: 114 mg/dL
eAG (mmol/L): 6.3 mmol/L

## 2022-11-03 LAB — URIC ACID: Uric Acid, Serum: 8 mg/dL (ref 4.0–8.0)

## 2022-11-06 ENCOUNTER — Telehealth: Payer: Self-pay | Admitting: *Deleted

## 2022-11-06 DIAGNOSIS — M109 Gout, unspecified: Secondary | ICD-10-CM

## 2022-11-06 DIAGNOSIS — E781 Pure hyperglyceridemia: Secondary | ICD-10-CM

## 2022-11-06 DIAGNOSIS — I10 Essential (primary) hypertension: Secondary | ICD-10-CM

## 2022-11-06 NOTE — Telephone Encounter (Signed)
Pt called and I read the message to him and he stated that he was not fasting for labs and he stated he is willing to try something different but its up to the provider because he has been taking hydrochlorothiazide for so long that he would be surprised if the medication caused anything. Pt stated he did not see an effect of gout and he never had it.

## 2022-11-06 NOTE — Telephone Encounter (Signed)
Left message to call office

## 2022-11-06 NOTE — Telephone Encounter (Signed)
-----   Message from Glori Luis, MD sent at 11/05/2022 12:32 PM EDT ----- Please let patient know that his triglycerides were fairly elevated during his recent labs.  I suspect he was not fasting though can you confirm if he was fasting or not?  I would suggest rechecking his lipid panel in 1 month if he was not fasting.  His uric acid was above goal.  This is a marker of gout.  I would like to see this level less than 6.  His hydrochlorothiazide could be contributing to the uric acid being above goal.  Would he be willing to switch hydrochlorothiazide to something different?  His other labs are acceptable.

## 2022-11-07 NOTE — Telephone Encounter (Signed)
Left message to call the office back regarding Dr. Sonnenberg's recommendations 

## 2022-11-07 NOTE — Telephone Encounter (Signed)
I would suggest we switch the HCTZ to valsartan to see if we can get his uric acid down and prevent any future gout flares. I can send this in if he is willing and we can recheck labs in 3-4 weeks.

## 2022-11-07 NOTE — Telephone Encounter (Signed)
Patient is agreeable to try the Valsartan and recheck labs on 12/04/22.

## 2022-11-08 MED ORDER — VALSARTAN 80 MG PO TABS
80.0000 mg | ORAL_TABLET | Freq: Every day | ORAL | 3 refills | Status: DC
Start: 2022-11-08 — End: 2022-11-26

## 2022-11-08 NOTE — Telephone Encounter (Signed)
Patient is aware to fast for these labs.

## 2022-11-08 NOTE — Telephone Encounter (Signed)
Noted. Valsartan sent to pharmacy. Labs ordered. Patient needs to be fasting for these labs. Thanks.

## 2022-11-09 ENCOUNTER — Other Ambulatory Visit: Payer: Self-pay | Admitting: Family Medicine

## 2022-11-09 DIAGNOSIS — E7800 Pure hypercholesterolemia, unspecified: Secondary | ICD-10-CM

## 2022-11-12 DIAGNOSIS — Z1211 Encounter for screening for malignant neoplasm of colon: Secondary | ICD-10-CM | POA: Diagnosis not present

## 2022-11-21 ENCOUNTER — Other Ambulatory Visit: Payer: Self-pay | Admitting: Family Medicine

## 2022-11-21 DIAGNOSIS — N400 Enlarged prostate without lower urinary tract symptoms: Secondary | ICD-10-CM

## 2022-11-24 ENCOUNTER — Encounter: Payer: Self-pay | Admitting: Family Medicine

## 2022-11-24 DIAGNOSIS — I10 Essential (primary) hypertension: Secondary | ICD-10-CM

## 2022-11-26 LAB — COLOGUARD: COLOGUARD: NEGATIVE

## 2022-11-26 MED ORDER — HYDROCHLOROTHIAZIDE 25 MG PO TABS: 25.0000 mg | ORAL_TABLET | Freq: Every day | ORAL | 3 refills | Status: DC

## 2022-11-26 NOTE — Telephone Encounter (Signed)
Patient is aware of my chart message and scheduled for labs in one month.

## 2022-12-04 ENCOUNTER — Other Ambulatory Visit (INDEPENDENT_AMBULATORY_CARE_PROVIDER_SITE_OTHER): Payer: Medicare HMO

## 2022-12-04 DIAGNOSIS — E781 Pure hyperglyceridemia: Secondary | ICD-10-CM

## 2022-12-04 DIAGNOSIS — I10 Essential (primary) hypertension: Secondary | ICD-10-CM

## 2022-12-04 DIAGNOSIS — M109 Gout, unspecified: Secondary | ICD-10-CM

## 2022-12-04 LAB — BASIC METABOLIC PANEL
BUN: 17 mg/dL (ref 6–23)
CO2: 31 mEq/L (ref 19–32)
Calcium: 9.5 mg/dL (ref 8.4–10.5)
Chloride: 100 mEq/L (ref 96–112)
Creatinine, Ser: 0.86 mg/dL (ref 0.40–1.50)
GFR: 86.53 mL/min (ref >60.00)
Glucose, Bld: 90 mg/dL (ref 70–99)
Potassium: 3.6 mEq/L (ref 3.5–5.1)
Sodium: 141 mEq/L (ref 135–145)

## 2022-12-04 LAB — LIPID PANEL
Cholesterol: 135 mg/dL (ref 0–200)
HDL: 44 mg/dL (ref >39.00)
LDL Cholesterol: 59 mg/dL (ref 0–99)
NonHDL: 90.79
Total CHOL/HDL Ratio: 3
Triglycerides: 158 mg/dL — ABNORMAL HIGH (ref 0.0–149.0)
VLDL: 31.6 mg/dL (ref 0.0–40.0)

## 2022-12-04 LAB — URIC ACID: Uric Acid, Serum: 8.1 mg/dL — ABNORMAL HIGH (ref 4.0–7.8)

## 2022-12-05 ENCOUNTER — Ambulatory Visit (INDEPENDENT_AMBULATORY_CARE_PROVIDER_SITE_OTHER): Payer: Medicare HMO | Admitting: Family Medicine

## 2022-12-05 ENCOUNTER — Encounter: Payer: Self-pay | Admitting: Family Medicine

## 2022-12-05 VITALS — BP 122/76 | HR 84 | Temp 97.8°F | Ht 69.0 in | Wt 234.0 lb

## 2022-12-05 DIAGNOSIS — L989 Disorder of the skin and subcutaneous tissue, unspecified: Secondary | ICD-10-CM

## 2022-12-05 DIAGNOSIS — T63301A Toxic effect of unspecified spider venom, accidental (unintentional), initial encounter: Secondary | ICD-10-CM

## 2022-12-05 DIAGNOSIS — I10 Essential (primary) hypertension: Secondary | ICD-10-CM | POA: Diagnosis not present

## 2022-12-05 NOTE — Assessment & Plan Note (Signed)
I suspect the patient's lesion is related to a spider bite given his description.  This has been improving.  At this time I do not feel as though there is any indication for antibiotics as it does not seem that there is any infection.  Advised he can take ibuprofen 600 mg every 8 hours as needed for any discomfort.  He can continue the topical antibiotic if he would like.  If he has any worsening symptoms or spreading redness, increased swelling, or drainage of pus he will contact us right away.

## 2022-12-05 NOTE — Assessment & Plan Note (Signed)
Chronic issue.  Adequately controlled today.  He will continue HCTZ 25 mg daily.

## 2022-12-05 NOTE — Progress Notes (Signed)
Marikay Alar, MD Phone: [phone]  Jeffrey Romero is a 72 y.o. male who presents today for same day visit.   Insect bite: Patient reports he was cutting the grass on Saturday and afterwards noted an area on his left lower extremity that was blood red and swollen and itched.  He notes it was very tender.  He notes the discomfort and the redness were the worst on Sunday though have progressively improved.  He does still have some discomfort.  He has had no fevers.  He did not note any specific bite or sting while he was cutting the grass.  He has been using antibiotic ointment on this.  Hypertension: Patient reports his stomach upset has resolved since stopping the valsartan.  He is back on HCTZ.  Labs from yesterday revealed acceptable kidney function and electrolytes.  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 90 tablet 1   hydrochlorothiazide (HYDRODIURIL) 25 MG tablet Take 1 tablet (25 mg total) by mouth daily. 90 tablet 3   ibuprofen (ADVIL,MOTRIN) 200 MG tablet Take 200 mg by mouth as needed.      Omega-3 Fatty Acids (FISH OIL) 1200 MG CAPS Take 1,200 mg by mouth.     simvastatin (ZOCOR) 40 MG tablet TAKE 1 TABLET BY MOUTH EVERY DAY 30 tablet 5   tamsulosin (FLOMAX) 0.4 MG CAPS capsule TAKE 1 CAPSULE BY MOUTH EVERY DAY 90 capsule 1   triamcinolone cream (KENALOG) 0.1 % APPLY A SMALL AMOUNT TO AFFECTED AREA AS DIRECTED TWICE A DAY FOR 2 WEEKS THEN DECREASE TO DAILY 5 TIMES PER WEEK. AVOID FACE, GROIN, UNDERARMS. 454 g 1   No current  facility-administered medications on file prior to visit.     ROS see history of present illness  Objective  Physical Exam Vitals:   12/05/22 1333  BP: 122/76  Pulse: 84  Temp: 97.8 F (36.6 C)  SpO2: 98%    BP Readings from Last 3 Encounters:  12/05/22 122/76  11/02/22 124/74  06/26/21 130/80   Wt Readings from Last 3 Encounters:  12/05/22 234 lb (106.1 kg)  11/02/22 253 lb 6.4 oz (114.9 kg)  04/25/22 250 lb (113.4 kg)    Physical Exam Skin:          Assessment/Plan: Please see individual problem list.  Spider bite wound, accidental or unintentional, initial encounter Assessment & Plan: I suspect the patient's lesion is related to a spider bite given his description.  This has been improving.  At this time I do not feel as though there is any indication for antibiotics as it does not seem that there is any infection.  Advised he can take ibuprofen 600 mg every 8 hours as needed for any discomfort.  He can continue the topical antibiotic if he would like.  If he has any worsening symptoms or spreading redness, increased swelling, or drainage of pus he will contact us right away.   Primary hypertension Assessment & Plan: Chronic issue.  Adequately controlled today.  He will  continue HCTZ 25 mg daily.      Return if symptoms worsen or fail to improve.   Marikay Alar, MD [facility]

## 2022-12-14 ENCOUNTER — Telehealth: Payer: Self-pay | Admitting: *Deleted

## 2022-12-14 DIAGNOSIS — I10 Essential (primary) hypertension: Secondary | ICD-10-CM

## 2022-12-14 NOTE — Telephone Encounter (Signed)
Orders placed.

## 2022-12-14 NOTE — Telephone Encounter (Signed)
Please place future orders for lab appt.  

## 2022-12-21 ENCOUNTER — Other Ambulatory Visit (INDEPENDENT_AMBULATORY_CARE_PROVIDER_SITE_OTHER): Payer: Medicare HMO

## 2022-12-21 DIAGNOSIS — I10 Essential (primary) hypertension: Secondary | ICD-10-CM

## 2022-12-21 LAB — BASIC METABOLIC PANEL
BUN: 18 mg/dL (ref 6–23)
CO2: 32 mEq/L (ref 19–32)
Calcium: 9.4 mg/dL (ref 8.4–10.5)
Chloride: 99 mEq/L (ref 96–112)
Creatinine, Ser: 0.86 mg/dL (ref 0.40–1.50)
GFR: 86.5 mL/min (ref >60.00)
Glucose, Bld: 95 mg/dL (ref 70–99)
Potassium: 3.5 mEq/L (ref 3.5–5.1)
Sodium: 140 mEq/L (ref 135–145)

## 2023-02-27 ENCOUNTER — Encounter: Payer: Self-pay | Admitting: Family Medicine

## 2023-02-27 NOTE — Telephone Encounter (Signed)
He does not need to wait until August 19 to be seen.  Please get him scheduled with somebody sooner.

## 2023-02-27 NOTE — Telephone Encounter (Signed)
Pt called in to make an appt with provider. Pt its booked for 03/11/23.

## 2023-02-28 ENCOUNTER — Telehealth: Payer: Self-pay

## 2023-02-28 NOTE — Telephone Encounter (Signed)
Noted  

## 2023-02-28 NOTE — Telephone Encounter (Signed)
Called Patient trying to get him scheduled sooner than 03/11/23 for a possible eye infection  like Dr. Birdie Sons stated.

## 2023-02-28 NOTE — Telephone Encounter (Signed)
LMTCB

## 2023-02-28 NOTE — Telephone Encounter (Signed)
Patient returned office phone call and was schedule with Evelene Croon tomorrow,02/28/2023.

## 2023-03-01 ENCOUNTER — Encounter: Payer: Self-pay | Admitting: Nurse Practitioner

## 2023-03-01 ENCOUNTER — Ambulatory Visit: Payer: Medicare HMO | Admitting: Nurse Practitioner

## 2023-03-01 VITALS — BP 122/76 | HR 68 | Temp 97.6°F | Ht 69.0 in | Wt 249.2 lb

## 2023-03-01 DIAGNOSIS — H1031 Unspecified acute conjunctivitis, right eye: Secondary | ICD-10-CM | POA: Diagnosis not present

## 2023-03-01 MED ORDER — MOXIFLOXACIN HCL 0.5 % OP SOLN: 1.0000 [drp] | Freq: Three times a day (TID) | OPHTHALMIC | 0 refills | Status: DC

## 2023-03-01 NOTE — Telephone Encounter (Signed)
Patient is scheduled to see Kara Dies, NP on 03/01/23.

## 2023-03-01 NOTE — Patient Instructions (Addendum)
Vigamox eye drops sent to the pharmacy. Advised to use warm compress.  If symptoms not improving let us know.

## 2023-03-01 NOTE — Progress Notes (Signed)
Established Patient Office Visit  Subjective:  Patient ID: Jeffrey Romero, male    DOB: August 18, 1950  Age: 72 y.o. MRN: [id number]  CC:  Chief Complaint  Patient presents with   Acute Visit    Red, burning puffy right eye feels as if there is a sty in the bottom eyelid    HPI  Jeffrey Romero presents for burning , watery and itching eye over a week. No discharge from the eyes.  Denise any visual changes,   HPI   Past Medical History:  Diagnosis Date   BPH (benign prostatic hyperplasia) 03/01/2016   EXTERNAL HEMORRHOIDS WITHOUT MENTION COMP    HYPERLIPIDEMIA    HYPERTENSION    Osteoarthritis of knee    Prostate cancer [facility])     Past Surgical History:  Procedure Laterality Date   CYSTOSCOPY N/A 08/25/2018   Procedure: CYSTOSCOPY FLEXIBLE;  Surgeon: Malen Gauze, MD;  Location: [facility];  Service: Urology;  Laterality: N/A;  NO SEEDS FOUND IN BLADDER   Neck fusion  05 & 07   PROSTATE BIOPSY     RADIOACTIVE SEED IMPLANT N/A 08/25/2018   Procedure: RADIOACTIVE SEED IMPLANT/BRACHYTHERAPY IMPLANT;  Surgeon: Malen Gauze, MD;  Location: [facility];  Service: Urology;  Laterality: N/A;   77 SEEDS FOUND IN BLADDER   right knee surgery  2006   SPACE OAR INSTILLATION N/A 08/25/2018   Procedure: SPACE OAR INSTILLATION;  Surgeon: Malen Gauze, MD;  Location: [facility];  Service: Urology;  Laterality: N/A;   TONSILLECTOMY AND ADENOIDECTOMY     TOTAL KNEE ARTHROPLASTY  08/2010   bilateral - olin    Family History  Adopted: Yes  Family history unknown: Yes    Social History   Socioeconomic History   Marital status: Single    Spouse name: Not on file   Number of children: 1   Years of education: Not on file   Highest education level: Some college, no degree  Occupational History   Not on file  Tobacco Use   Smoking status: Never   Smokeless tobacco: Never   Tobacco comments:    partially retired -  computer work; 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  Vaping Use   Vaping status: Never Used  Substance and Sexual Activity   Alcohol use: Yes    Alcohol/week: 0.0 standard drinks of alcohol    Comment: Occasionally wine or beer    Drug use: No   Sexual activity: Not Currently  Other Topics Concern   Not on file  Social History Narrative   Retired    Lives by himself    Pets: 1 dog inside    Caffeine- 1 cup coffee    Right handed    Enjoys yard work, Database administrator Strain: Low Risk  (10/31/2022)   Overall Financial Resource Strain (CARDIA)    Difficulty of Paying Living Expenses: Not hard at all  Food Insecurity: No Food Insecurity (10/31/2022)   Hunger Vital Sign    Worried About Running Out of Food in the Last Year: Never true    Ran Out of Food in the Last Year: Never true  Transportation Needs: No Transportation Needs (10/31/2022)   PRAPARE - Administrator, Civil Service (Medical): No    Lack of Transportation (Non-Medical): No  Physical Activity: Insufficiently Active (10/31/2022)   Exercise Vital Sign    Days of Exercise per  Week: 3 days    Minutes of Exercise per Session: 40 min  Stress: No Stress Concern Present (10/31/2022)   [employer] of Occupational Health - Occupational Stress Questionnaire    Feeling of Stress : Not at all  Social Connections: Moderately Integrated (10/31/2022)   Social Connection and Isolation Panel [NHANES]    Frequency of Communication with Friends and Family: More than three times a week    Frequency of Social Gatherings with Friends and Family: Twice a week    Attends Religious Services: More than 4 times per year    Active Member of [employer] or Organizations: Yes    Attends Banker Meetings: 1 to 4 times per year    Marital Status: Divorced  Catering manager Violence: Not At Risk (04/25/2022)   Humiliation, Afraid, Rape, and Kick questionnaire    Fear of Current or  Ex-Partner: No    Emotionally Abused: No    Physically Abused: No    Sexually Abused: No     Outpatient Medications Prior to Visit  Medication Sig Dispense Refill   colchicine 0.6 MG tablet TAKE 1.2 MG (2 TABLETS) AT THE FIRST SIGN OF A GOUT FLARE, THEN TAKE 0.6 MG (1 TABLET) ONE HOUR LATER, ON DAY 2 AND AFTER TAKE 0.6 MG (1 TABLET) TWICE DAILY UNTIL 48 HOURS AFTER RESOLUTION OF SYMPTOMS. 90 tablet 1   hydrochlorothiazide (HYDRODIURIL) 25 MG tablet Take 1 tablet (25 mg total) by mouth daily. 90 tablet 3   ibuprofen (ADVIL,MOTRIN) 200 MG tablet Take 200 mg by mouth as needed.      Omega-3 Fatty Acids (FISH OIL) 1200 MG CAPS Take 1,200 mg by mouth.     simvastatin (ZOCOR) 40 MG tablet TAKE 1 TABLET BY MOUTH EVERY DAY 30 tablet 5   tamsulosin (FLOMAX) 0.4 MG CAPS capsule TAKE 1 CAPSULE BY MOUTH EVERY DAY 90 capsule 1   triamcinolone cream (KENALOG) 0.1 % APPLY A SMALL AMOUNT TO AFFECTED AREA AS DIRECTED TWICE A DAY FOR 2 WEEKS THEN DECREASE TO DAILY 5 TIMES PER WEEK. AVOID FACE, GROIN, UNDERARMS. 454 g 1   No facility-administered medications prior to visit.    No Known Allergies  ROS Review of Systems Negative unless indicated in HPI.    Objective:    Physical Exam Constitutional:      Appearance: Normal appearance.  Eyes:     General: Lids are normal.        Right eye: No foreign body or discharge.        Left eye: No discharge.     Pupils: Pupils are equal, round, and reactive to light.     Comments: Right sclera pink and watery.   Cardiovascular:     Pulses: Normal pulses.     Heart sounds: Normal heart sounds.  Pulmonary:     Effort: Pulmonary effort is normal.     Breath sounds: No wheezing.  Musculoskeletal:     Cervical back: Normal range of motion.  Neurological:     General: No focal deficit present.     Mental Status: He is alert and oriented to person, place, and time. Mental status is at baseline.  Psychiatric:        Mood and Affect: Mood normal.         Behavior: Behavior normal.        Thought Content: Thought content normal.        Judgment: Judgment normal.     BP 122/76   Pulse 68  Temp 97.6 F (36.4 C)   Ht 5\' 9"  (1.753 m)   Wt 249 lb 3.2 oz (113 kg)   SpO2 97%   BMI 36.80 kg/m  Wt Readings from Last 3 Encounters:  03/01/23 249 lb 3.2 oz (113 kg)  12/05/22 234 lb (106.1 kg)  11/02/22 253 lb 6.4 oz (114.9 kg)     Health Maintenance  Topic Date Due   COVID-19 Vaccine (4 - 2023-24 season) 03/23/2022   Medicare Annual Wellness (AWV)  04/26/2023   INFLUENZA VACCINE  10/21/2023 (Originally 02/21/2023)   Fecal DNA (Cologuard)  11/11/2025   DTaP/Tdap/Td (4 - Td or Tdap) 12/22/2027   Pneumonia Vaccine 53+ Years old  Completed   Hepatitis C Screening  Completed   Zoster Vaccines- Shingrix  Completed   HPV VACCINES  Aged Out    There are no preventive care reminders to display for this patient.  Lab Results  Component Value Date   TSH 2.02 11/05/2012   Lab Results  Component Value Date   WBC 6.6 11/02/2022   HGB 16.3 11/02/2022   HCT 47.0 11/02/2022   MCV 91.1 11/02/2022   PLT 267 11/02/2022   Lab Results  Component Value Date   NA 140 12/21/2022   K 3.5 12/21/2022   CO2 32 12/21/2022   GLUCOSE 95 12/21/2022   BUN 18 12/21/2022   CREATININE 0.86 12/21/2022   BILITOT 0.6 11/02/2022   ALKPHOS 63 06/26/2021   AST 28 11/02/2022   ALT 24 11/02/2022   PROT 7.3 11/02/2022   ALBUMIN 4.5 06/26/2021   CALCIUM 9.4 12/21/2022   ANIONGAP 8 08/18/2018   GFR 86.50 12/21/2022   Lab Results  Component Value Date   CHOL 135 12/04/2022   Lab Results  Component Value Date   HDL 44.00 12/04/2022   Lab Results  Component Value Date   LDLCALC 59 12/04/2022   Lab Results  Component Value Date   TRIG 158.0 (H) 12/04/2022   Lab Results  Component Value Date   CHOLHDL 3 12/04/2022   Lab Results  Component Value Date   HGBA1C 5.6 11/02/2022      Assessment & Plan:  Acute conjunctivitis of right eye,  unspecified acute conjunctivitis type Assessment & Plan: Will treat with Vigamox eye drops.  Advised to use warm compress. He will let us know if the symptoms not improving.    Other orders -     Moxifloxacin HCl; Place 1 drop into the right eye 3 (three) times daily.  Dispense: 3 mL; Refill: 0    Follow-up: Return if symptoms worsen or fail to improve.   Kara Dies, NP

## 2023-03-01 NOTE — Assessment & Plan Note (Signed)
Will treat with Vigamox eye drops.  Advised to use warm compress. He will let us know if the symptoms not improving.

## 2023-03-11 ENCOUNTER — Ambulatory Visit: Payer: Medicare HMO | Admitting: Family Medicine

## 2023-04-22 ENCOUNTER — Telehealth: Payer: Self-pay | Admitting: Family Medicine

## 2023-04-22 NOTE — Telephone Encounter (Signed)
Copied from CRM [id number]. Topic: Medicare AWV >> Apr 22, 2023  9:18 AM Payton Doughty wrote: Reason for CRM: Called LM 04/22/2023 to schedule AWV   Verlee Rossetti; Care Guide Ambulatory Clinical Support [place] [facility] Direct Dial: [phone]

## 2023-05-05 ENCOUNTER — Encounter: Payer: Self-pay | Admitting: Family Medicine

## 2023-05-06 ENCOUNTER — Ambulatory Visit: Payer: Medicare HMO | Admitting: Family Medicine

## 2023-05-06 ENCOUNTER — Telehealth: Payer: Self-pay | Admitting: Family Medicine

## 2023-05-06 NOTE — Telephone Encounter (Signed)
Copied from CRM [id number]. Topic: Medicare AWV >> May 06, 2023 11:37 AM Jeffrey Romero wrote: Reason for CRM: Called LVM 05/06/2023 to schedule Annual Wellness Visit  Verlee Rossetti; Care Guide Ambulatory Clinical Support [place] [facility] Direct Dial: [phone]

## 2023-05-07 ENCOUNTER — Other Ambulatory Visit: Payer: Self-pay | Admitting: Family Medicine

## 2023-05-07 DIAGNOSIS — N400 Enlarged prostate without lower urinary tract symptoms: Secondary | ICD-10-CM

## 2023-05-09 ENCOUNTER — Other Ambulatory Visit: Payer: Self-pay | Admitting: Family Medicine

## 2023-05-09 DIAGNOSIS — E7800 Pure hypercholesterolemia, unspecified: Secondary | ICD-10-CM

## 2023-05-13 ENCOUNTER — Ambulatory Visit (INDEPENDENT_AMBULATORY_CARE_PROVIDER_SITE_OTHER): Payer: Medicare HMO | Admitting: Family Medicine

## 2023-05-13 ENCOUNTER — Encounter: Payer: Self-pay | Admitting: Family Medicine

## 2023-05-13 VITALS — BP 120/74 | HR 70 | Temp 97.6°F | Ht 69.0 in | Wt 248.6 lb

## 2023-05-13 DIAGNOSIS — E6609 Other obesity due to excess calories: Secondary | ICD-10-CM

## 2023-05-13 DIAGNOSIS — E7800 Pure hypercholesterolemia, unspecified: Secondary | ICD-10-CM

## 2023-05-13 DIAGNOSIS — I10 Essential (primary) hypertension: Secondary | ICD-10-CM | POA: Diagnosis not present

## 2023-05-13 DIAGNOSIS — L309 Dermatitis, unspecified: Secondary | ICD-10-CM | POA: Insufficient documentation

## 2023-05-13 DIAGNOSIS — Z23 Encounter for immunization: Secondary | ICD-10-CM

## 2023-05-13 MED ORDER — TRIAMCINOLONE ACETONIDE 0.1 % EX CREA
TOPICAL_CREAM | CUTANEOUS | 1 refills | Status: DC
Start: 2023-05-13 — End: 2024-04-03

## 2023-05-13 NOTE — Assessment & Plan Note (Signed)
Chronic issue.  Encouraged healthy diet and exercise. 

## 2023-05-13 NOTE — Progress Notes (Signed)
Marikay Alar, MD Phone: [phone]  Jeffrey Romero is a 72 y.o. male who presents today for follow-up.  Hypertension: Not checking at home.  Taking HCTZ.  No chest pain, shortness of breath, or edema.  Hyperlipidemia: Taking simvastatin.  No right upper quadrant pain or myalgias.  Walks about 2 miles most days for exercise.  Has an omelette with [place] and cheese for breakfast.  Has a sandwich or soup for lunch.  He will have a casserole with some vegetables for dinner.  Notes he could be getting more fruits and vegetables.  No soda.  Occasional sweet tea.  No fried foods.  Eczema: Patient notes dermatology diagnosed with eczema.  The triamcinolone is effective when he has to use it.  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 90 tablet 1   hydrochlorothiazide (HYDRODIURIL) 25 MG tablet Take 1 tablet (25 mg total) by mouth daily. 90 tablet 3   ibuprofen (ADVIL,MOTRIN) 200 MG tablet Take 200 mg by mouth as needed.      Omega-3 Fatty Acids (FISH OIL) 1200 MG CAPS Take 1,200 mg by mouth.     simvastatin (ZOCOR) 40 MG tablet TAKE 1 TABLET BY MOUTH EVERY DAY 90 tablet 1   tamsulosin (FLOMAX) 0.4 MG CAPS capsule TAKE 1 CAPSULE BY MOUTH EVERY DAY 90 capsule 1   No current facility-administered medications on file prior to visit.     ROS see history of present illness  Objective  Physical Exam Vitals:   05/13/23 1325  BP: 120/74  Pulse: 70  Temp: 97.6 F (36.4 C)  SpO2: 98%    BP Readings from Last 3 Encounters:  05/13/23 120/74  03/01/23  122/76  12/05/22 122/76   Wt Readings from Last 3 Encounters:  05/13/23 248 lb 9.6 oz (112.8 kg)  03/01/23 249 lb 3.2 oz (113 kg)  12/05/22 234 lb (106.1 kg)    Physical Exam Constitutional:      General: He is not in acute distress.    Appearance: He is not diaphoretic.  Cardiovascular:     Rate and Rhythm: Normal rate and regular rhythm.     Heart sounds: Normal heart sounds.  Pulmonary:     Effort: Pulmonary effort is normal.     Breath sounds: Normal breath sounds.  Skin:    General: Skin is warm and dry.  Neurological:     Mental Status: He is alert.      Assessment/Plan: Please see individual problem list.  Eczema, unspecified type Assessment & Plan: Chronic issue.  Okay to use triamcinolone as prescribed.  Orders: -     Triamcinolone Acetonide; APPLY A SMALL AMOUNT TO AFFECTED AREA AS DIRECTED TWICE A DAY FOR 2 WEEKS THEN DECREASE TO DAILY 5 TIMES PER WEEK. AVOID FACE, GROIN, UNDERARMS.  Dispense: 454 g; Refill: 1  Encounter for administration of vaccine -     Flu Vaccine Trivalent High Dose (Fluad)  Primary hypertension Assessment & Plan: Chronic issue.  Adequately controlled.  Patient will continue HCTZ 25  mg daily.   Hypercholesterolemia Assessment & Plan: Chronic issue.  Continue simvastatin 40 mg daily.   Obesity due to excess calories with serious comorbidity, unspecified class Assessment & Plan: Chronic issue.  Encouraged healthy diet and exercise.     Return in about 6 months (around 11/11/2023) for transfer of care.   Marikay Alar, MD [facility]

## 2023-05-13 NOTE — Assessment & Plan Note (Signed)
Chronic issue.  Continue simvastatin 40 mg daily.

## 2023-05-13 NOTE — Assessment & Plan Note (Signed)
Chronic issue.  Okay to use triamcinolone as prescribed.

## 2023-05-13 NOTE — Assessment & Plan Note (Signed)
Chronic issue.  Adequately controlled.  Patient will continue HCTZ 25 mg daily.

## 2023-06-10 ENCOUNTER — Encounter: Payer: Self-pay | Admitting: Family Medicine

## 2023-06-11 ENCOUNTER — Ambulatory Visit (INDEPENDENT_AMBULATORY_CARE_PROVIDER_SITE_OTHER): Payer: Medicare HMO | Admitting: Family Medicine

## 2023-06-11 ENCOUNTER — Encounter: Payer: Self-pay | Admitting: Family Medicine

## 2023-06-11 VITALS — BP 128/78 | HR 66 | Temp 97.6°F | Ht 69.0 in | Wt 255.4 lb

## 2023-06-11 DIAGNOSIS — J0100 Acute maxillary sinusitis, unspecified: Secondary | ICD-10-CM | POA: Diagnosis not present

## 2023-06-11 MED ORDER — AMOXICILLIN-POT CLAVULANATE 875-125 MG PO TABS
1.0000 | ORAL_TABLET | Freq: Two times a day (BID) | ORAL | 0 refills | Status: DC
Start: 2023-06-11 — End: 2023-06-14

## 2023-06-11 NOTE — Assessment & Plan Note (Signed)
Symptoms are consistent with sinusitis.  Given duration of symptoms we will start Augmentin 1 tablet twice daily.  Discussed that it could take 2 days to improve.  If he is not improving by Friday he will let us know.  If he worsens he will be seen again.  Advised on risk of diarrhea with the Augmentin.  If it is excessive he will contact us.

## 2023-06-11 NOTE — Progress Notes (Signed)
Marikay Alar, MD Phone: [phone]  Jeffrey Romero is a 72 y.o. male who presents today for same-day visit.  Sinusitis: Patient feels as though he has sinusitis.  He is had congestion in his maxillary sinuses since 05/13/2023.  He has no postnasal drip, sore throat, shortness of breath, fevers, or cough.  He is blowing white/yellow mucus out of his nose.  He did not test himself for COVID.  He has used Mucinex, DayQuil, NyQuil, and several nose sprays with minimal benefit.  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 90 tablet 1   hydrochlorothiazide (HYDRODIURIL) 25 MG tablet Take 1 tablet (25 mg total) by mouth daily. 90 tablet 3   ibuprofen (ADVIL,MOTRIN) 200 MG tablet Take 200 mg by mouth as needed.      Omega-3 Fatty Acids (FISH OIL) 1200 MG CAPS Take 1,200 mg by mouth.     simvastatin (ZOCOR) 40 MG tablet TAKE 1 TABLET BY MOUTH EVERY DAY 90 tablet 1   tamsulosin (FLOMAX) 0.4 MG CAPS capsule TAKE 1 CAPSULE BY MOUTH EVERY DAY 90 capsule 1   triamcinolone cream (KENALOG) 0.1 % APPLY A SMALL AMOUNT TO AFFECTED AREA AS DIRECTED TWICE A DAY FOR 2 WEEKS THEN DECREASE TO DAILY 5 TIMES PER WEEK. AVOID FACE, GROIN, UNDERARMS. 454 g 1   No current facility-administered medications on file prior to visit.     ROS see history of present illness  Objective  Physical Exam Vitals:   06/11/23 0910  BP: 128/78  Pulse: 66  Temp: 97.6 F (36.4 C)  SpO2: 98%    BP Readings from Last 3 Encounters:  06/11/23 128/78  05/13/23 120/74  03/01/23 122/76    Wt Readings from Last 3 Encounters:  06/11/23 255 lb 6.4 oz (115.8 kg)  05/13/23 248 lb 9.6 oz (112.8 kg)  03/01/23 249 lb 3.2 oz (113 kg)    Physical Exam Constitutional:      General: He is not in acute distress.    Appearance: He is not diaphoretic.  HENT:     Head:     Comments: Sinuses nontender to percussion    Left Ear: Tympanic membrane normal.     Ears:     Comments: Right TM obscured by cerumen Cardiovascular:     Rate and Rhythm: Normal rate and regular rhythm.     Heart sounds: Normal heart sounds.  Pulmonary:     Effort: Pulmonary effort is normal.     Breath sounds: Normal breath sounds.  Skin:    General: Skin is warm and dry.  Neurological:     Mental Status: He is alert.      Assessment/Plan: Please see individual problem list.  Acute non-recurrent maxillary sinusitis Assessment & Plan: Symptoms are consistent with sinusitis.  Given duration of symptoms we will start Augmentin 1 tablet twice daily.  Discussed that it could take 2 days to improve.  If he is not improving by Friday he will let us know.  If he worsens he will be  seen again.  Advised on risk of diarrhea with the Augmentin.  If it is excessive he will contact us.  Orders: -     Amoxicillin-Pot Clavulanate; Take 1 tablet by mouth 2 (two) times daily.  Dispense: 14 tablet; Refill: 0     Return if symptoms worsen or fail to improve.   Marikay Alar, MD [facility]

## 2023-06-14 ENCOUNTER — Encounter: Payer: Self-pay | Admitting: Family Medicine

## 2023-06-14 DIAGNOSIS — J0140 Acute pansinusitis, unspecified: Secondary | ICD-10-CM

## 2023-06-14 MED ORDER — DOXYCYCLINE HYCLATE 100 MG PO TABS: 100.0000 mg | ORAL_TABLET | Freq: Two times a day (BID) | ORAL | 0 refills | Status: DC

## 2023-06-18 NOTE — Addendum Note (Signed)
Addended by: Glori Luis on: 06/18/2023 09:03 AM   Modules accepted: Orders

## 2023-06-26 DIAGNOSIS — J310 Chronic rhinitis: Secondary | ICD-10-CM | POA: Diagnosis not present

## 2023-06-26 DIAGNOSIS — J302 Other seasonal allergic rhinitis: Secondary | ICD-10-CM | POA: Diagnosis not present

## 2023-06-26 DIAGNOSIS — J328 Other chronic sinusitis: Secondary | ICD-10-CM | POA: Diagnosis not present

## 2023-06-26 DIAGNOSIS — J324 Chronic pansinusitis: Secondary | ICD-10-CM | POA: Diagnosis not present

## 2023-07-23 DIAGNOSIS — J328 Other chronic sinusitis: Secondary | ICD-10-CM | POA: Diagnosis not present

## 2023-07-23 DIAGNOSIS — J301 Allergic rhinitis due to pollen: Secondary | ICD-10-CM | POA: Diagnosis not present

## 2023-08-02 ENCOUNTER — Encounter: Payer: Self-pay | Admitting: Family

## 2023-08-02 NOTE — Telephone Encounter (Signed)
 Spoke to pt and scheduled him an appt for 08/05/23 with Birdie Sons

## 2023-08-05 ENCOUNTER — Ambulatory Visit: Payer: Medicare HMO | Admitting: Family Medicine

## 2023-10-31 ENCOUNTER — Other Ambulatory Visit: Payer: Self-pay

## 2023-10-31 DIAGNOSIS — E7800 Pure hypercholesterolemia, unspecified: Secondary | ICD-10-CM

## 2023-10-31 DIAGNOSIS — N400 Enlarged prostate without lower urinary tract symptoms: Secondary | ICD-10-CM

## 2023-10-31 MED ORDER — SIMVASTATIN 40 MG PO TABS: ORAL_TABLET | ORAL | 0 refills | Status: DC

## 2023-10-31 MED ORDER — HYDROCHLOROTHIAZIDE 25 MG PO TABS: 25.0000 mg | ORAL_TABLET | Freq: Every day | ORAL | 0 refills | Status: DC

## 2023-10-31 MED ORDER — TAMSULOSIN HCL 0.4 MG PO CAPS: 0.4000 mg | ORAL_CAPSULE | Freq: Every day | ORAL | 0 refills | Status: DC

## 2023-11-14 ENCOUNTER — Encounter: Payer: Self-pay | Admitting: Family

## 2023-11-14 ENCOUNTER — Ambulatory Visit (INDEPENDENT_AMBULATORY_CARE_PROVIDER_SITE_OTHER): Payer: Medicare HMO | Admitting: Family

## 2023-11-14 VITALS — BP 130/70 | HR 74 | Temp 97.6°F | Ht 69.0 in | Wt 253.4 lb

## 2023-11-14 DIAGNOSIS — M109 Gout, unspecified: Secondary | ICD-10-CM

## 2023-11-14 DIAGNOSIS — E7800 Pure hypercholesterolemia, unspecified: Secondary | ICD-10-CM | POA: Diagnosis not present

## 2023-11-14 DIAGNOSIS — I10 Essential (primary) hypertension: Secondary | ICD-10-CM

## 2023-11-14 DIAGNOSIS — Z8546 Personal history of malignant neoplasm of prostate: Secondary | ICD-10-CM

## 2023-11-14 DIAGNOSIS — E6609 Other obesity due to excess calories: Secondary | ICD-10-CM | POA: Diagnosis not present

## 2023-11-14 MED ORDER — HYDROCHLOROTHIAZIDE 25 MG PO TABS: 25.0000 mg | ORAL_TABLET | Freq: Every day | ORAL | 3 refills | Status: DC

## 2023-11-14 MED ORDER — SIMVASTATIN 40 MG PO TABS: ORAL_TABLET | ORAL | 3 refills | Status: AC

## 2023-11-14 NOTE — Assessment & Plan Note (Signed)
 Chronic, stable. Asymptomatic. Counseled on increased risk of gout, CKD, nephrolithiasis. Pending uric acid. Continue prn use of colchicine .

## 2023-11-14 NOTE — Progress Notes (Signed)
 Assessment & Plan:  Primary hypertension Assessment & Plan: Chronic, stable. Continue hctx 25mg  every day.   Orders: -     CBC with Differential/Platelet; Future -     Comprehensive metabolic panel with GFR; Future -     TSH; Future  Hypercholesterolemia Assessment & Plan: Chronic, presume stable. Pending lipid panel. Continue zocor  40mg  every day.   Orders: -     VITAMIN D 25 Hydroxy (Vit-D Deficiency, Fractures); Future -     CBC with Differential/Platelet; Future -     Comprehensive metabolic panel with GFR; Future -     TSH; Future -     Lipid panel; Future -     Simvastatin ; TAKE 1 TABLET BY MOUTH EVERY DAY  Dispense: 90 tablet; Refill: 3 -     Uric acid; Future  Obesity due to excess calories with serious comorbidity, unspecified class -     VITAMIN D 25 Hydroxy (Vit-D Deficiency, Fractures); Future  Gout, unspecified cause, unspecified chronicity, unspecified site Assessment & Plan: Chronic, stable. Asymptomatic. Counseled on increased risk of gout, CKD, nephrolithiasis. Pending uric acid. Continue prn use of colchicine .    Orders: -     Uric acid; Future  History of prostate cancer Assessment & Plan: Chronic ,stable. Continue q56mos surveillance with  [facility] Dr Johnie Nailer    Other orders -     hydroCHLOROthiazide ; Take 1 tablet (25 mg total) by mouth daily.  Dispense: 90 tablet; Refill: 3     Return precautions given.   Risks, benefits, and alternatives of the medications and treatment plan prescribed today were discussed, and patient expressed understanding.   Education regarding symptom management and diagnosis given to patient on AVS either electronically or printed.  Return in about 6 months (around 05/15/2024) for Fasting labs in 2-3 weeks.  Bascom Bossier, FNP  Subjective:    Patient ID: Jeffrey Romero, male    DOB: March 26, 1951, 73 y.o.   MRN: [id number]  CC: Jeffrey Romero is a 73 y.o. male who presents today to  establish care.    HPI: Feels well today  No new complaints  No recent gout flare; he has 2 flares  in total in right great toe. Rare use of colchicine   No h/o renal stone  He walking 2 miles daily; enjoys fishing.  No sob, CP.      Never smoker  H/o prostate cancer. He follows with urology q6 mos [facility] Dr Johnie Nailer whom prescribes flomax   Cologuard UTD  Allergies: Patient has no known allergies. Current Outpatient Medications on File Prior to Visit  Medication Sig Dispense Refill   colchicine  0.6 MG tablet TAKE 1.2 MG (2 TABLETS) AT THE FIRST SIGN OF A GOUT FLARE, THEN TAKE 0.6 MG (1 TABLET) ONE HOUR LATER, ON DAY 2 AND AFTER TAKE 0.6 MG (1 TABLET) TWICE DAILY UNTIL 48 HOURS AFTER RESOLUTION OF SYMPTOMS. 90 tablet 1   ibuprofen (ADVIL,MOTRIN) 200 MG tablet Take 200 mg by mouth as needed.      Omega-3 Fatty Acids (FISH OIL) 1200 MG CAPS Take 1,200 mg by mouth.     tamsulosin  (FLOMAX ) 0.4 MG CAPS capsule Take 1 capsule (0.4 mg total) by mouth daily. 30 capsule 0   triamcinolone  cream (KENALOG ) 0.1 % APPLY A SMALL AMOUNT TO AFFECTED AREA AS DIRECTED TWICE A DAY FOR 2 WEEKS THEN DECREASE TO DAILY 5 TIMES PER WEEK. AVOID FACE, GROIN, UNDERARMS. 454 g 1   No current facility-administered medications on  file prior to visit.    Review of Systems  Constitutional:  Negative for chills and fever.  Respiratory:  Negative for cough.   Cardiovascular:  Negative for chest pain and palpitations.  Gastrointestinal:  Negative for nausea and vomiting.      Objective:    BP 130/70   Pulse 74   Temp 97.6 F (36.4 C) (Oral)   Ht 5\' 9"  (1.753 m)   Wt 253 lb 6.4 oz (114.9 kg)   SpO2 95%   BMI 37.42 kg/m  BP Readings from Last 3 Encounters:  11/14/23 130/70  06/11/23 128/78  05/13/23 120/74   Wt Readings from Last 3 Encounters:  11/14/23 253 lb 6.4 oz (114.9 kg)  06/11/23 255 lb 6.4 oz (115.8 kg)  05/13/23 248 lb 9.6 oz (112.8 kg)    Physical Exam Vitals  reviewed.  Constitutional:      Appearance: He is well-developed.  Cardiovascular:     Rate and Rhythm: Regular rhythm.     Heart sounds: Normal heart sounds.  Pulmonary:     Effort: Pulmonary effort is normal. No respiratory distress.     Breath sounds: Normal breath sounds. No wheezing, rhonchi or rales.  Skin:    General: Skin is warm and dry.  Neurological:     Mental Status: He is alert.  Psychiatric:        Speech: Speech normal.        Behavior: Behavior normal.

## 2023-11-14 NOTE — Assessment & Plan Note (Signed)
 Chronic ,stable. Continue q58mos surveillance with  [facility] Dr Johnie Nailer

## 2023-11-14 NOTE — Assessment & Plan Note (Signed)
 Chronic, presume stable. Pending lipid panel. Continue zocor  40mg  every day.

## 2023-11-14 NOTE — Assessment & Plan Note (Signed)
 Chronic, stable. Continue hctx 25mg  every day.

## 2023-11-23 ENCOUNTER — Other Ambulatory Visit: Payer: Self-pay | Admitting: Internal Medicine

## 2023-11-23 DIAGNOSIS — N400 Enlarged prostate without lower urinary tract symptoms: Secondary | ICD-10-CM

## 2023-12-04 NOTE — Telephone Encounter (Signed)
 Patient called and E2c2 read message to call urology for refill.

## 2023-12-04 NOTE — Telephone Encounter (Signed)
 Please refuse medication pt is aware he needs to call urology to request.

## 2023-12-04 NOTE — Telephone Encounter (Signed)
 LMTCB

## 2023-12-04 NOTE — Telephone Encounter (Signed)
 Per Mariah Shines she has let pt know that he needs to call the urology office to request the refill for this medication.

## 2023-12-24 ENCOUNTER — Other Ambulatory Visit (INDEPENDENT_AMBULATORY_CARE_PROVIDER_SITE_OTHER)

## 2023-12-24 DIAGNOSIS — E7800 Pure hypercholesterolemia, unspecified: Secondary | ICD-10-CM

## 2023-12-24 DIAGNOSIS — E6609 Other obesity due to excess calories: Secondary | ICD-10-CM

## 2023-12-24 DIAGNOSIS — M109 Gout, unspecified: Secondary | ICD-10-CM | POA: Diagnosis not present

## 2023-12-24 DIAGNOSIS — I10 Essential (primary) hypertension: Secondary | ICD-10-CM | POA: Diagnosis not present

## 2023-12-24 LAB — COMPREHENSIVE METABOLIC PANEL WITH GFR
ALT: 28 U/L (ref 0–53)
AST: 25 U/L (ref 0–37)
Albumin: 4.6 g/dL (ref 3.5–5.2)
Alkaline Phosphatase: 62 U/L (ref 39–117)
BUN: 16 mg/dL (ref 6–23)
CO2: 31 meq/L (ref 19–32)
Calcium: 9.7 mg/dL (ref 8.4–10.5)
Chloride: 101 meq/L (ref 96–112)
Creatinine, Ser: 0.81 mg/dL (ref 0.40–1.50)
GFR: 87.46 mL/min (ref >60.00)
Glucose, Bld: 95 mg/dL (ref 70–99)
Potassium: 3.8 meq/L (ref 3.5–5.1)
Sodium: 141 meq/L (ref 135–145)
Total Bilirubin: 0.8 mg/dL (ref 0.2–1.2)
Total Protein: 6.7 g/dL (ref 6.0–8.3)

## 2023-12-24 LAB — CBC WITH DIFFERENTIAL/PLATELET
Basophils Absolute: 0 10*3/uL (ref 0.0–0.1)
Basophils Relative: 0.4 % (ref 0.0–3.0)
Eosinophils Absolute: 0.5 10*3/uL (ref 0.0–0.7)
Eosinophils Relative: 7.2 % — ABNORMAL HIGH (ref 0.0–5.0)
HCT: 44.1 % (ref 39.0–52.0)
Hemoglobin: 15.1 g/dL (ref 13.0–17.0)
Lymphocytes Relative: 25.9 % (ref 12.0–46.0)
Lymphs Abs: 1.7 10*3/uL (ref 0.7–4.0)
MCHC: 34.3 g/dL (ref 30.0–36.0)
MCV: 89.8 fl (ref 78.0–100.0)
Monocytes Absolute: 0.8 10*3/uL (ref 0.1–1.0)
Monocytes Relative: 11.6 % (ref 3.0–12.0)
Neutro Abs: 3.6 10*3/uL (ref 1.4–7.7)
Neutrophils Relative %: 54.9 % (ref 43.0–77.0)
Platelets: 242 10*3/uL (ref 150.0–400.0)
RBC: 4.92 Mil/uL (ref 4.22–5.81)
RDW: 14 % (ref 11.5–15.5)
WBC: 6.6 10*3/uL (ref 4.0–10.5)

## 2023-12-24 LAB — LIPID PANEL
Cholesterol: 173 mg/dL (ref 0–200)
HDL: 53.4 mg/dL (ref >39.00)
LDL Cholesterol: 79 mg/dL (ref 0–99)
NonHDL: 119.44
Total CHOL/HDL Ratio: 3
Triglycerides: 202 mg/dL — ABNORMAL HIGH (ref 0.0–149.0)
VLDL: 40.4 mg/dL — ABNORMAL HIGH (ref 0.0–40.0)

## 2023-12-24 LAB — URIC ACID: Uric Acid, Serum: 8.2 mg/dL — ABNORMAL HIGH (ref 4.0–7.8)

## 2023-12-24 LAB — TSH: TSH: 1.75 u[IU]/mL (ref 0.35–5.50)

## 2023-12-24 LAB — VITAMIN D 25 HYDROXY (VIT D DEFICIENCY, FRACTURES): VITD: 21.8 ng/mL — ABNORMAL LOW (ref 30.00–100.00)

## 2023-12-31 ENCOUNTER — Ambulatory Visit: Payer: Self-pay | Admitting: Family

## 2023-12-31 DIAGNOSIS — Z8639 Personal history of other endocrine, nutritional and metabolic disease: Secondary | ICD-10-CM

## 2023-12-31 DIAGNOSIS — R899 Unspecified abnormal finding in specimens from other organs, systems and tissues: Secondary | ICD-10-CM

## 2023-12-31 MED ORDER — CHOLECALCIFEROL 1.25 MG (50000 UT) PO TABS: ORAL_TABLET | ORAL | 0 refills | Status: DC

## 2024-01-01 ENCOUNTER — Telehealth: Payer: Self-pay

## 2024-01-01 ENCOUNTER — Ambulatory Visit: Payer: Self-pay

## 2024-01-01 NOTE — Telephone Encounter (Signed)
 Noted! I agree please schedule pt regarding this issue when he calls back

## 2024-01-01 NOTE — Telephone Encounter (Signed)
 FYI Only or Action Required?: FYI only for provider  Patient was last seen in primary care on 11/14/2023 by Calista Catching, FNP. Called Nurse Triage reporting Back Pain. Symptoms began x1 month & worsening x 3 days. Interventions attempted: OTC medications: ibuprofen. Symptoms are: gradually worsening.  Triage Disposition: See Physician Within 24 Hours: referred pt to urgent care: pt verbalized understanding.  Patient/caregiver understands and will follow disposition?: Yes    Copied from CRM [id number]. Topic: Clinical - Red Word Triage >> Jan 01, 2024  5:32 PM Chuck Crater wrote: Red Word that prompted transfer to Nurse Triage:  Patient has a pinched nerve in his elbow that is gradually irritating him more everyday with extreme pain.  *Offered acute appointment per crm message with provider or someone else patient declined/ wanted to be seen sooner. Reason for Disposition  [1] Numbness in an arm or hand (i.e., loss of sensation) AND [2] upper back pain    10/10 Pain located between shoulder blades that is constant: pt states  Numbness, tingling & weakness in left arm that comes and goes: referred patient to urgent care  Answer Assessment - Initial Assessment Questions 1. ONSET: When did the pain begin?      Ongoing x month and worsening last x 3 days 2. LOCATION: Where does it hurt? (upper, mid or lower back)     Between should blades 10/10 3. SEVERITY: How bad is the pain?  (e.g., Scale 1-10; mild, moderate, or severe)   - MILD (1-3): Doesn't interfere with normal activities.    - MODERATE (4-7): Interferes with normal activities or awakens from sleep.    - SEVERE (8-10): Excruciating pain, unable to do any normal activities.      Severe : pt has not slept in 2 nights due to pain 4. PATTERN: Is the pain constant? (e.g., yes, no; constant, intermittent)      constant 5. RADIATION: Does the pain shoot into your legs or somewhere else? no 6. CAUSE:  What do you think is  causing the back pain?      Possible pinched nerves 7. BACK OVERUSE:  Any recent lifting of heavy objects, strenuous work or exercise?     N/a 8. MEDICINES: What have you taken so far for the pain? (e.g., nothing, acetaminophen , NSAIDS)     *No Answer* 9. NEUROLOGIC SYMPTOMS: Do you have any weakness, numbness, or problems with bowel/bladder control?     Left arm numbness and tingling and weakness x 3 days: can move fingers, squeeze things, etc 10. OTHER SYMPTOMS: Do you have any other symptoms? (e.g., fever, abdomen pain, burning with urination, blood in urine)  tingling in left arm 11. PREGNANCY: Is there any chance you are pregnant? When was your last menstrual period?       N/a  Protocols used: Back Pain-A-AH
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
## 2024-01-02 ENCOUNTER — Ambulatory Visit
Admission: RE | Admit: 2024-01-02 | Discharge: 2024-01-02 | Disposition: A | Discharge status: Home / Self Care | Source: Ambulatory Visit | Attending: Emergency Medicine | Admitting: Emergency Medicine

## 2024-01-02 VITALS — BP 165/96 | HR 76 | Temp 97.8°F | Resp 16

## 2024-01-02 DIAGNOSIS — M546 Pain in thoracic spine: Secondary | ICD-10-CM | POA: Diagnosis not present

## 2024-01-02 DIAGNOSIS — S46812A Strain of other muscles, fascia and tendons at shoulder and upper arm level, left arm, initial encounter: Secondary | ICD-10-CM

## 2024-01-02 MED ORDER — PREDNISONE 10 MG (21) PO TBPK: ORAL_TABLET | Freq: Every day | ORAL | 0 refills | Status: DC

## 2024-01-02 MED ORDER — METHYLPREDNISOLONE ACETATE 80 MG/ML IJ SUSP
60.0000 mg | Freq: Once | INTRAMUSCULAR | Status: AC
  Administered 2024-01-02: 60 mg via INTRAMUSCULAR

## 2024-01-02 MED ORDER — BACLOFEN 10 MG PO TABS: 10.0000 mg | ORAL_TABLET | Freq: Every evening | ORAL | 0 refills | Status: DC | PRN

## 2024-01-02 NOTE — ED Triage Notes (Signed)
 Pt c/o back pain with stabbing pain between his shoulder blades and states he is having some numbness and tingling in his left arm x 1 month that has gotten worse in the past 3 days.

## 2024-01-02 NOTE — Discharge Instructions (Signed)
 Your pain is most likely caused by irritation to the muscles.  You have been given an injection of methylprednisolone  here today in the clinic which is a steroid to reduce inflammation and help reduce pain and ideally will start to see improvement within the hour  Start tomorrow take oral prednisone  as directed continue the above process, may use Tylenol  or any topical medicines additionally  May use baclofen which is a muscle relaxer at bedtime if needed to help you rest  You may use heating pad in 15 minute intervals as needed for additional comfort,  Begin massaging and stretching affected area daily for 10 minutes as tolerated to further loosen muscles   When sitting and lying down place pillow underneath and between knees for support  Can try sleeping without pillow on firm mattress as this keeps the body in alignment   Practice good posture: head back, shoulders back, chest forward, pelvis back and weight distributed evenly on both legs  If pain persist after recommended treatment or reoccurs if may be beneficial to follow up with orthopedic specialist for evaluation, this doctor specializes in the bones and can manage your symptoms long-term with options such as but not limited to imaging, medications or physical therapy
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
## 2024-01-02 NOTE — Telephone Encounter (Signed)
 Pt has [facility] appt scheduled today

## 2024-01-06 NOTE — Telephone Encounter (Signed)
 Seen uc 01/02/24 Scheduled with me 01/28/24

## 2024-01-10 ENCOUNTER — Ambulatory Visit
Admission: RE | Admit: 2024-01-10 | Discharge: 2024-01-10 | Disposition: A | Discharge status: Home / Self Care | Source: Ambulatory Visit | Attending: Emergency Medicine | Admitting: Emergency Medicine

## 2024-01-10 ENCOUNTER — Ambulatory Visit (INDEPENDENT_AMBULATORY_CARE_PROVIDER_SITE_OTHER)

## 2024-01-10 ENCOUNTER — Telehealth: Payer: Self-pay | Admitting: Emergency Medicine

## 2024-01-10 VITALS — BP 162/97 | HR 73 | Temp 97.9°F | Resp 16

## 2024-01-10 DIAGNOSIS — M542 Cervicalgia: Secondary | ICD-10-CM | POA: Diagnosis not present

## 2024-01-10 DIAGNOSIS — S12500A Unspecified displaced fracture of sixth cervical vertebra, initial encounter for closed fracture: Secondary | ICD-10-CM | POA: Diagnosis not present

## 2024-01-10 DIAGNOSIS — Z981 Arthrodesis status: Secondary | ICD-10-CM | POA: Diagnosis not present

## 2024-01-10 MED ORDER — METHYLPREDNISOLONE ACETATE 80 MG/ML IJ SUSP
60.0000 mg | Freq: Once | INTRAMUSCULAR | Status: AC
  Administered 2024-01-10: 60 mg via INTRAMUSCULAR

## 2024-01-10 MED ORDER — PREDNISONE 10 MG (21) PO TBPK: ORAL_TABLET | Freq: Every day | ORAL | 0 refills | Status: DC

## 2024-01-10 MED ORDER — BACLOFEN 10 MG PO TABS: 10.0000 mg | ORAL_TABLET | Freq: Every evening | ORAL | 0 refills | Status: DC | PRN

## 2024-01-10 NOTE — Discharge Instructions (Signed)
 Your pain is most likely caused by irritation to the muscles.  X-ray is pending, you will be notified of results via telephone  You may give an injection of steroids ideally mostly relief within the hour, start oral steroid pills tomorrow, extend for swelling 6 to 12 days  May continue use of muscle relaxant at bedtime  You may use heating pad in 15 minute intervals as needed for additional comfort  Begin stretching affected area daily for 10 minutes as tolerated to further loosen muscles   When lying down place pillow underneath and between knees for support  Can try sleeping without pillow on firm mattress   Practice good posture: head back, shoulders back, chest forward, pelvis back and weight distributed evenly on both legs  If pain persist after recommended treatment or reoccurs if may be beneficial to follow up with orthopedic specialist for evaluation, this doctor specializes in the bones and can manage your symptoms long-term with options such as but not limited to imaging, medications or physical therapy

## 2024-01-10 NOTE — Telephone Encounter (Signed)
 Reported x-ray results via telephone, 2 patient identifiers used, fragmented screw noted, this was reported via telephone from radiology, discussed findings with patient, will follow-up with his surgeon or orthopedic referral given, instructed on how to retrieve imaging on CD, may follow-up as needed for pain management

## 2024-01-10 NOTE — ED Triage Notes (Signed)
 Pt c/o of pain in upper back between shoulder blades that started a month ago. Pt was seen on 6/12 and stated the prescribed medications and shot helped, but pain came back two days ago.

## 2024-01-10 NOTE — ED Provider Notes (Signed)
 Jeffrey Romero    CSN: [id number] Arrival date & time: 01/10/24  1456      History   Chief Complaint Chief Complaint  Patient presents with   Back Pain    I saw Alayne Allis on 6/12  for stabbing pain between shoulder blades. Once shot and pills have worn off..the pain is back - Entered by patient    HPI Jeffrey Romero is a 73 y.o. male.   Patient presents for evaluation of reoccurring mid to left shoulder blade pain beginning approximately 1 month ago without precipitating event, injury or trauma.  Pain flared 2 days ago after completion of oral steroid course and muscle relaxants which had been helpful.  Endorses symptoms did improve but did not fully resolve.  Exacerbated by movement of the left arm and can be felt when laterally turning the neck.  Denies numbness or tingling.  Past Medical History:  Diagnosis Date   BPH (benign prostatic hyperplasia) 03/01/2016   EXTERNAL HEMORRHOIDS WITHOUT MENTION COMP    HYPERLIPIDEMIA    HYPERTENSION    Osteoarthritis of knee    Prostate cancer [facility])     Patient Active Problem List   Diagnosis Date Noted   Acute non-recurrent maxillary sinusitis 06/11/2023   Eczema 05/13/2023   Spider bite 12/05/2022   History of prostate cancer 06/11/2018   BPH (benign prostatic hyperplasia) 03/01/2016   HTN (hypertension) 03/01/2016   Gout 02/17/2016   Hypercholesterolemia 12/07/2015   Obese    Osteoarthritis 07/30/2007    Past Surgical History:  Procedure Laterality Date   CYSTOSCOPY N/A 08/25/2018   Procedure: CYSTOSCOPY FLEXIBLE;  Surgeon: Marco Severs, MD;  Location: [facility];  Service: Urology;  Laterality: N/A;  NO SEEDS FOUND IN BLADDER   Neck fusion  05 & 07   PROSTATE BIOPSY     RADIOACTIVE SEED IMPLANT N/A 08/25/2018   Procedure: RADIOACTIVE SEED IMPLANT/BRACHYTHERAPY IMPLANT;  Surgeon: Marco Severs, MD;  Location: [facility];  Service: Urology;  Laterality: N/A;   77 SEEDS  FOUND IN BLADDER   right knee surgery  2006   SPACE OAR INSTILLATION N/A 08/25/2018   Procedure: SPACE OAR INSTILLATION;  Surgeon: Marco Severs, MD;  Location: [facility];  Service: Urology;  Laterality: N/A;   TONSILLECTOMY AND ADENOIDECTOMY     TOTAL KNEE ARTHROPLASTY  08/2010   bilateral - olin       Home Medications    Prior to Admission medications   Medication Sig Start Date End Date Taking? Authorizing Provider  predniSONE  (STERAPRED UNI-PAK 21 TAB) 10 MG (21) TBPK tablet Take by mouth daily. Take 6 tabs by mouth daily  for 2 days, then 5 tabs for 2 days, then 4 tabs for 2 days, then 3 tabs for 2 days, 2 tabs for 2 days, then 1 tab by mouth daily for 2 days 01/10/24  Yes Fields Oros R, NP  baclofen  (LIORESAL ) 10 MG tablet Take 1 tablet (10 mg total) by mouth at bedtime as needed for muscle spasms. 01/10/24   Veena Sturgess, Maybelle Spatz, NP  Cholecalciferol  1.25 MG (50000 UT) TABS 50,000 units PO qwk for 8 weeks. 12/31/23   Calista Catching, FNP  colchicine  0.6 MG tablet TAKE 1.2 MG (2 TABLETS) AT THE FIRST SIGN OF A GOUT FLARE, THEN TAKE 0.6 MG (1 TABLET) ONE HOUR LATER, ON DAY 2 AND AFTER TAKE 0.6 MG (1 TABLET) TWICE DAILY UNTIL 48 HOURS AFTER RESOLUTION OF SYMPTOMS. 07/31/21  Kent Pear, MD  hydrochlorothiazide  (HYDRODIURIL ) 25 MG tablet Take 1 tablet (25 mg total) by mouth daily. 11/14/23   Calista Catching, FNP  ibuprofen (ADVIL,MOTRIN) 200 MG tablet Take 200 mg by mouth as needed.     [provider]  Omega-3 Fatty Acids (FISH OIL) 1200 MG CAPS Take 1,200 mg by mouth.    [provider]  simvastatin  (ZOCOR ) 40 MG tablet TAKE 1 TABLET BY MOUTH EVERY DAY 11/14/23   Calista Catching, FNP  tamsulosin  (FLOMAX ) 0.4 MG CAPS capsule Take 1 capsule (0.4 mg total) by mouth daily. 10/31/23   Thersia Flax, MD  triamcinolone  cream (KENALOG ) 0.1 % APPLY A SMALL AMOUNT TO AFFECTED AREA AS DIRECTED TWICE A DAY FOR 2 WEEKS THEN DECREASE TO DAILY 5 TIMES  PER WEEK. AVOID FACE, GROIN, UNDERARMS. 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   Review of Systems Review of Systems   Physical Exam Triage Vital Signs ED Triage Vitals  Encounter Vitals Group     BP 01/10/24 1531 (!) 162/97     Girls Systolic BP Percentile --      Girls Diastolic BP Percentile --      Boys Systolic BP Percentile --      Boys Diastolic BP Percentile --      Pulse Rate 01/10/24 1529 73     Resp 01/10/24 1529 16     Temp 01/10/24 1529 97.9 F (36.6 C)     Temp Source 01/10/24 1529 Oral     SpO2 01/10/24 1529 99 %     Weight --      Height --      Head Circumference --      Peak Flow --      Pain Score 01/10/24 1530 8     Pain Loc --      Pain Education --      Exclude from Growth Chart --    No data found.  Updated Vital Signs BP (!) 162/97   Pulse 73   Temp 97.9 F (36.6 C) (Oral)   Resp 16   SpO2 99%   Visual Acuity Right Eye Distance:   Left Eye Distance:   Bilateral Distance:    Right Eye Near:   Left Eye Near:    Bilateral Near:     Physical Exam Constitutional:      Appearance: Normal appearance.   Eyes:     Extraocular Movements: Extraocular movements intact.   Neck:     Comments: Tenderness present to the base of the right lateral aspect of the neck, no spinal tenderness noted, able to complete range of motion but lateral turning to the left elicits pain, no rigidity or crepitus, 2+ carotid pulsePulmonary:     Effort: Pulmonary  effort is normal.   Musculoskeletal:       Arms:     Comments:  Tenderness present to the center of the right shoulder blade, no ecchymosis swelling or deformity, able to twist turn and bend, has full range of motion of the right arm without tenderness to the [facility] joint, 2+ brachial pulse, negative Hawking sign   Neurological:     Mental Status: He is alert and oriented to person, place, and time. Mental status is at baseline.      UC Treatments / Results  Labs (all labs ordered are listed, but only abnormal results are displayed) Labs Reviewed - No data to display  EKG   Radiology No results found.  Procedures Procedures (including critical care time)  Medications Ordered in UC Medications  methylPREDNISolone  acetate (DEPO-MEDROL ) injection 60 mg (60 mg Intramuscular Given 01/10/24 1609)    Initial Impression / Assessment and Plan / UC Course  I have reviewed the triage vital signs and the nursing notes.  Pertinent labs & imaging results that were available during my care of the patient were reviewed by me and considered in my medical decision making (see chart for details).  Neck pain  Etiology most likely muscular however symptoms have persisted for about a month without full resolution x-ray completed of the cervical chain, pending, will notify of results via telephone, methylprednisolone  IM given and prescribed oral prednisone  for home use extended course from 6 to 12 days, refilled baclofen  recommended supportive care and given walker referral to orthopedics Final Clinical Impressions(s) / UC Diagnoses   Final diagnoses:  Neck pain     Discharge Instructions      Your pain is most likely caused by irritation to the muscles.  X-ray is pending, you will be notified of results via telephone  You may give an injection of steroids ideally mostly relief within the hour, start oral steroid pills tomorrow, extend for swelling 6 to 12 days  May continue use of muscle  relaxant at bedtime  You may use heating pad in 15 minute intervals as needed for additional comfort  Begin stretching affected area daily for 10 minutes as tolerated to further loosen muscles   When lying down place pillow underneath and between knees for support  Can try sleeping without pillow on firm mattress   Practice good posture: head back, shoulders back, chest forward, pelvis back and weight distributed evenly on both legs  If pain persist after recommended treatment or reoccurs if may be beneficial to follow up with orthopedic specialist for evaluation, this doctor specializes in the bones and can manage your symptoms long-term with options such as but not limited to imaging, medications or physical therapy      ED Prescriptions     Medication Sig Dispense Auth. Provider   predniSONE  (STERAPRED UNI-PAK 21 TAB) 10 MG (21) TBPK tablet Take by mouth daily. Take 6 tabs by mouth daily  for 2 days, then 5 tabs for 2 days, then 4 tabs for 2 days, then 3 tabs for 2 days, 2 tabs for 2 days, then 1 tab by mouth daily for 2 days 42 tablet Jorel Gravlin R, NP   baclofen  (LIORESAL ) 10 MG tablet Take 1 tablet (10 mg total) by mouth at bedtime as needed for muscle spasms. 10 each Reena Canning, NP      PDMP not reviewed this encounter.   Reena Canning, [place] 01/10/24 [phone]

## 2024-01-13 ENCOUNTER — Ambulatory Visit (HOSPITAL_COMMUNITY): Payer: Self-pay

## 2024-01-28 ENCOUNTER — Telehealth (INDEPENDENT_AMBULATORY_CARE_PROVIDER_SITE_OTHER): Admitting: Family

## 2024-01-28 ENCOUNTER — Encounter: Payer: Self-pay | Admitting: Family

## 2024-01-28 VITALS — Ht 69.0 in | Wt 253.4 lb

## 2024-01-28 DIAGNOSIS — E7800 Pure hypercholesterolemia, unspecified: Secondary | ICD-10-CM | POA: Diagnosis not present

## 2024-01-28 DIAGNOSIS — M542 Cervicalgia: Secondary | ICD-10-CM | POA: Diagnosis not present

## 2024-01-28 DIAGNOSIS — M109 Gout, unspecified: Secondary | ICD-10-CM

## 2024-01-28 DIAGNOSIS — G479 Sleep disorder, unspecified: Secondary | ICD-10-CM | POA: Insufficient documentation

## 2024-01-28 MED ORDER — GABAPENTIN 100 MG PO CAPS: 100.0000 mg | ORAL_CAPSULE | Freq: Three times a day (TID) | ORAL | 1 refills | Status: DC

## 2024-01-28 NOTE — Assessment & Plan Note (Signed)
 Discussed ASCVD risk score. No known family h/o ( he is adopted). Discussed further statifying his ASCVD risk with CT calcium  score. He will schedule. Continue zocor  40mg .

## 2024-01-28 NOTE — Progress Notes (Signed)
 Virtual Visit via Video Note  I connected with Jeffrey Romero on 01/28/24 at 10:00 AM EDT by a video enabled telemedicine application and verified that I am speaking with the correct person using two identifiers. Location patient: home Location provider: work  Persons participating in the virtual visit: patient, provider  I discussed the limitations of evaluation and management by telemedicine and the availability of in person appointments. The patient expressed understanding and agreed to proceed.  HPI: Follow up cholesterol and gout He has had trouble falling asleep. Exacerbated by back to back prednisone  course, completed 8 days ago and still trouble staying asleep. Neck pain has improved.  Weird dreams on melatonin.   Left arm numbness traveling from bicep to forearm. No numbness in hand. No associated CP, sob, jaw pain.   He complains of neck pain and 2 visits to UC. He had fall 2 months ago when carrying limbs ; he tripped and fell forward. He didn't feel that he hurt himself at that time.Soon after however he had a sharp pain in neck.    Seen at [facility] 01/02/24 and treated with baclofen  and prednisone  with relief.  Returned to [facility] 01/10/24  Prednisone  and Rest improves pain.   H/o cervical fusion  DG cervical spine 01/10/24 with Anterior fusion plate at R3-R2. Fractured screw fragment is seen on the right at the C6 level.  In the  past he tried alternative ( valsartan ) to hydrochlorothiazide  which was not effective for HTN.  Last flare for gout was 2 -3 years ago. He has one episode in great toe.    He has appointment with Dr Victory Gunnels 01/30/24.   He was adopted. No known family history.   Compliant with simvastatin .   ROS: See pertinent positives and negatives per HPI.  EXAM:  VITALS per patient if applicable: Ht 5' 9 (1.753 m)   Wt 253 lb 6.4 oz (114.9 kg)   BMI 37.42 kg/m  BP Readings from Last 3 Encounters:  01/10/24 (!) 162/97  01/02/24 (!) 165/96  11/14/23  130/70   Wt Readings from Last 3 Encounters:  01/28/24 253 lb 6.4 oz (114.9 kg)  11/14/23 253 lb 6.4 oz (114.9 kg)  06/11/23 255 lb 6.4 oz (115.8 kg)    GENERAL: alert, oriented, appears well and in no acute distress  HEENT: atraumatic, conjunttiva clear, no obvious abnormalities on inspection of external nose and ears  NECK: normal movements of the head and neck  LUNGS: on inspection no signs of respiratory distress, breathing rate appears normal, no obvious gross SOB, gasping or wheezing  CV: no obvious cyanosis  MS: moves all visible extremities without noticeable abnormality  PSYCH/NEURO: pleasant and cooperative, no obvious depression or anxiety, speech and thought processing grossly intact  ASSESSMENT AND PLAN: Neck pain Assessment & Plan: Reviewed UC visits and XR c spine. Concern for hardware malfunction. Appointment with Dr Victory Gunnels this week. Start gabapentin  100mg  TID and titrate. Counseled on sedation.   Orders: -     Gabapentin ; Take 1 capsule (100 mg total) by mouth 3 (three) times daily.  Dispense: 270 capsule; Refill: 1  Trouble in sleeping Assessment & Plan: Uncontrolled. Suspect neck pain contributory. Start gabapentin  and titrate. Consider trazodone.    Gout, unspecified cause, unspecified chronicity, unspecified site Assessment & Plan: Asymptomatic. Previously tried ARB in lieu of hydrochlorothiazide  with side effects. He is aware of risks of hyperuricemia and that are likely exacerbated by hydrochlorothiazide . Continue to monitor clinically.    Hypercholesterolemia Assessment &  Plan: Discussed ASCVD risk score. No known family h/o ( he is adopted). Discussed further statifying his ASCVD risk with CT calcium  score. He will schedule. Continue zocor  40mg .   Orders: -     CT CARDIAC SCORING (SELF PAY ONLY); Future     -we discussed possible serious and likely etiologies, options for evaluation and workup, limitations of telemedicine visit vs in  person visit, treatment, treatment risks and precautions. Pt prefers to treat via telemedicine empirically rather then risking or undertaking an in person visit at this moment.    I discussed the assessment and treatment plan with the patient. The patient was provided an opportunity to ask questions and all were answered. The patient agreed with the plan and demonstrated an understanding of the instructions.   The patient was advised to call back or seek an in-person evaluation if the symptoms worsen or if the condition fails to improve as anticipated.  Advised if desired AVS can be mailed or viewed via MyChart if Mychart user.   [name], FNP

## 2024-01-28 NOTE — Patient Instructions (Addendum)
 Trial of gabapentin  100mg  three time per day  After 4-5 days, you may increase the evening/bedtime dose to 200mg  at bedtime.   We will consider non melatonin options such as trazodone if sleep doesn't improve.    If you would like to further stratify your cardiovascular risk or consider medication management, I would first recommend obtaining a CT calcium  score.  Information included below.  Please let me know if you would like to order and I will place.    Once I have ordered, you may schedule on your own by calling [phone] ( [facility] [address] in [place] ).   An estimate of cost is $150-200 out-of-pocket as not covered by insurance.    Below an article from [facility] regarding the test.   https://[email or web address].org/imaging/exams-and-procedures/screenings/cardiac-ct#:~:text=A%20cardiac%20CT%20calcium%20score,arteries%20can%20cause%20heart%20attacks.   Exams We Offer: Cardiac CT Calcium  Score  Knowing your score could save your life. A cardiac CT calcium  score, also known as a coronary calcium  scan, is a quick, convenient and noninvasive way of evaluating the amount of calcified (hard) plaque in your heart vessels. The level of calcium  equates to the extent of plaque build-up in your arteries. Plaque in the arteries can cause heart attacks.  The radiologist reads the images and sends your doctor a report with a calcium  score. Patients with higher scores have a greater risk for a heart attack, heart disease or stroke. Knowing your score can help your doctor decide on blood pressure and cholesterol goals that will minimize your risk as much as possible.  The [employer] of [employer] found that Coronary artery calcification (CAC) is an excellent cardiovascular disease risk marker and can help guide the decision to use cholesterol reducing medications such as statins. A negative calcium  score may reduce the need for statins in otherwise eligible  patients.  The exam takes less than 10 minutes, is painless and does not require any IV or oral contrast.  Who should get a Cardiac CT Calcium  Score: Middle age adults at intermediate risk of heart disease Family history of heart disease Borderline high cholesterol, high blood pressure or diabetes Overweight or physical inactivity Uncertain about taking daily preventive medical therapy

## 2024-01-28 NOTE — Assessment & Plan Note (Signed)
 Uncontrolled. Suspect neck pain contributory. Start gabapentin  and titrate. Consider trazodone.

## 2024-01-28 NOTE — Assessment & Plan Note (Signed)
 Reviewed UC visits and XR c spine. Concern for hardware malfunction. Appointment with Dr Victory Gunnels this week. Start gabapentin  100mg  TID and titrate. Counseled on sedation.

## 2024-01-28 NOTE — Assessment & Plan Note (Signed)
 Asymptomatic. Previously tried ARB in lieu of hydrochlorothiazide  with side effects. He is aware of risks of hyperuricemia and that are likely exacerbated by hydrochlorothiazide . Continue to monitor clinically.

## 2024-01-31 ENCOUNTER — Ambulatory Visit
Admission: RE | Admit: 2024-01-31 | Discharge: 2024-01-31 | Disposition: A | Discharge status: Home / Self Care | Source: Ambulatory Visit | Attending: Family | Admitting: Family

## 2024-01-31 DIAGNOSIS — E7800 Pure hypercholesterolemia, unspecified: Secondary | ICD-10-CM | POA: Insufficient documentation

## 2024-02-04 ENCOUNTER — Other Ambulatory Visit: Payer: Self-pay | Admitting: Neurosurgery

## 2024-02-04 DIAGNOSIS — M5412 Radiculopathy, cervical region: Secondary | ICD-10-CM

## 2024-02-06 ENCOUNTER — Ambulatory Visit
Admission: RE | Admit: 2024-02-06 | Discharge: 2024-02-06 | Disposition: A | Discharge status: Home / Self Care | Source: Ambulatory Visit | Attending: Neurosurgery | Admitting: Neurosurgery

## 2024-02-06 DIAGNOSIS — M5412 Radiculopathy, cervical region: Secondary | ICD-10-CM | POA: Diagnosis not present

## 2024-02-06 DIAGNOSIS — R202 Paresthesia of skin: Secondary | ICD-10-CM | POA: Diagnosis not present

## 2024-02-06 DIAGNOSIS — M47813 Spondylosis without myelopathy or radiculopathy, cervicothoracic region: Secondary | ICD-10-CM | POA: Diagnosis not present

## 2024-02-06 DIAGNOSIS — M47812 Spondylosis without myelopathy or radiculopathy, cervical region: Secondary | ICD-10-CM | POA: Diagnosis not present

## 2024-02-06 DIAGNOSIS — M4802 Spinal stenosis, cervical region: Secondary | ICD-10-CM | POA: Diagnosis not present

## 2024-02-11 DIAGNOSIS — M5412 Radiculopathy, cervical region: Secondary | ICD-10-CM | POA: Diagnosis not present

## 2024-02-17 ENCOUNTER — Ambulatory Visit: Payer: Self-pay | Admitting: Family

## 2024-02-17 ENCOUNTER — Other Ambulatory Visit (INDEPENDENT_AMBULATORY_CARE_PROVIDER_SITE_OTHER)

## 2024-02-17 DIAGNOSIS — Z8639 Personal history of other endocrine, nutritional and metabolic disease: Secondary | ICD-10-CM

## 2024-02-17 LAB — CBC WITH DIFFERENTIAL/PLATELET
Basophils Absolute: 0 K/uL (ref 0.0–0.1)
Basophils Relative: 0.2 % (ref 0.0–3.0)
Eosinophils Absolute: 0.1 K/uL (ref 0.0–0.7)
Eosinophils Relative: 1.8 % (ref 0.0–5.0)
HCT: 42.3 % (ref 39.0–52.0)
Hemoglobin: 14.6 g/dL (ref 13.0–17.0)
Lymphocytes Relative: 26.8 % (ref 12.0–46.0)
Lymphs Abs: 1.6 K/uL (ref 0.7–4.0)
MCHC: 34.6 g/dL (ref 30.0–36.0)
MCV: 90.2 fl (ref 78.0–100.0)
Monocytes Absolute: 0.6 K/uL (ref 0.1–1.0)
Monocytes Relative: 10.5 % (ref 3.0–12.0)
Neutro Abs: 3.6 K/uL (ref 1.4–7.7)
Neutrophils Relative %: 60.7 % (ref 43.0–77.0)
Platelets: 241 K/uL (ref 150.0–400.0)
RBC: 4.69 Mil/uL (ref 4.22–5.81)
RDW: 13.9 % (ref 11.5–15.5)
WBC: 5.9 K/uL (ref 4.0–10.5)

## 2024-02-19 ENCOUNTER — Encounter: Payer: Self-pay | Admitting: Family

## 2024-02-22 ENCOUNTER — Encounter: Payer: Self-pay | Admitting: Family

## 2024-02-24 ENCOUNTER — Ambulatory Visit: Payer: Self-pay | Admitting: Family

## 2024-02-24 ENCOUNTER — Other Ambulatory Visit: Payer: Self-pay | Admitting: Family

## 2024-02-24 ENCOUNTER — Encounter: Payer: Self-pay | Admitting: Family

## 2024-02-24 DIAGNOSIS — I10 Essential (primary) hypertension: Secondary | ICD-10-CM

## 2024-02-24 DIAGNOSIS — R899 Unspecified abnormal finding in specimens from other organs, systems and tissues: Secondary | ICD-10-CM

## 2024-02-24 MED ORDER — AMLODIPINE BESYLATE 5 MG PO TABS: 5.0000 mg | ORAL_TABLET | Freq: Every day | ORAL | 3 refills | Status: DC

## 2024-02-26 ENCOUNTER — Other Ambulatory Visit: Payer: Self-pay | Admitting: Family

## 2024-02-26 DIAGNOSIS — E7800 Pure hypercholesterolemia, unspecified: Secondary | ICD-10-CM

## 2024-02-26 DIAGNOSIS — R931 Abnormal findings on diagnostic imaging of heart and coronary circulation: Secondary | ICD-10-CM

## 2024-03-02 ENCOUNTER — Ambulatory Visit
Admission: RE | Admit: 2024-03-02 | Discharge: 2024-03-02 | Disposition: A | Discharge status: Home / Self Care | Source: Ambulatory Visit | Attending: Family | Admitting: Family

## 2024-03-02 DIAGNOSIS — J984 Other disorders of lung: Secondary | ICD-10-CM | POA: Diagnosis not present

## 2024-03-02 DIAGNOSIS — R931 Abnormal findings on diagnostic imaging of heart and coronary circulation: Secondary | ICD-10-CM | POA: Diagnosis not present

## 2024-03-06 ENCOUNTER — Encounter: Payer: Self-pay | Admitting: Family

## 2024-03-24 ENCOUNTER — Ambulatory Visit: Payer: Self-pay | Admitting: Family

## 2024-03-25 NOTE — Telephone Encounter (Signed)
 Spoke to pt scheduled in person visit on 04/03/24

## 2024-04-01 NOTE — Progress Notes (Unsigned)
  Cardiology Office Note   Date:  04/02/2024  ID:  Jeffrey Romero, DOB [birth year], MRN [id number] PCP: Dineen Rollene MATSU, FNP  [facility] Providers Cardiologist:  Caron Poser, MD     History of Present Illness Jeffrey Romero is a 73 y.o. male PMH HTN, HLD, prostate CA who presents for further evaluation and management of an abnormal coronary calcium  score.  Patient reports he recently had a coronary calcium  score which was abnormal.  He is asymptomatic from a cardiovascular perspective.  He denies any exertional chest pressure or other chest symptoms.  He denies any exertional dyspnea and has good functional status.  He denies any personal history of CVD.  Last LDL 79 12/2023.  He reports he has been on a stable dose of simvastatin  for quite some time and has had intolerance to Crestor  and Lipitor in the past.  Relevant CVD History -CAC 156 02/04/2024 and three-vessel distribution   ROS: Pt denies any chest discomfort, jaw pain, arm pain, palpitations, syncope, presyncope, orthopnea, PND, or LE edema.  Studies Reviewed I have independently reviewed the patient's ECG, recent CT scan, recent blood work, and previous medical records.  Physical Exam VS:  BP 126/78 (BP Location: Right Arm, Patient Position: Sitting, Cuff Size: Normal)   Pulse 83   Ht 5' 9 (1.753 m)   Wt 253 lb (114.8 kg)   SpO2 99%   BMI 37.36 kg/m        Wt Readings from Last 3 Encounters:  04/02/24 253 lb (114.8 kg)  01/28/24 253 lb 6.4 oz (114.9 kg)  11/14/23 253 lb 6.4 oz (114.9 kg)    GEN: No acute distress. NECK: No JVD; No carotid bruits. CARDIAC: RRR, no murmurs, rubs, gallops. RESPIRATORY:  Clear to auscultation. EXTREMITIES:  Warm and well-perfused. No edema.  ASSESSMENT AND PLAN Elevated CAC HLD Patient presents for further evaluation of elevated CAC score.  He is at the 45th percentile for his demographics.  He is asymptomatic and functional without any exertional chest pain or  dyspnea.  At this point, there is no indication for further testing given his asymptomatic status.  We discussed trialing a different statin medicine or adding Zetia or Repatha to his regimen to help optimize his LDL.  His LDL is 79, I would recommend a goal of less than 70.  After long discussion, he would like to continue with lifestyle modifications for the time being instead of changing medications or adding something else.  He does have prior intolerance of Lipitor and Crestor .  Plan: - Continue ASA 81 mg daily - Continue simvastatin  40 mg daily; LDL goal less than 70.  If we are having trouble attaining this goal with simvastatin  and lifestyle modifications alone, I think adding Zetia or a switch to a higher intensity statin would get him there.  Repatha would almost certainly get him well below 70. - If he develops any symptoms of exertional dyspnea or exertional chest discomfort, then he would be a good candidate for coronary CT angiogram to further stratify.  HTN Well-controlled currently.  Continue Norvasc  5 mg daily.       Dispo: RTC 1 year or sooner as needed  Signed, Caron Poser, MD

## 2024-04-02 ENCOUNTER — Ambulatory Visit

## 2024-04-02 VITALS — BP 126/78 | HR 83 | Ht 69.0 in | Wt 253.0 lb

## 2024-04-02 DIAGNOSIS — E782 Mixed hyperlipidemia: Secondary | ICD-10-CM | POA: Diagnosis not present

## 2024-04-02 DIAGNOSIS — I2510 Atherosclerotic heart disease of native coronary artery without angina pectoris: Secondary | ICD-10-CM

## 2024-04-02 DIAGNOSIS — I10 Essential (primary) hypertension: Secondary | ICD-10-CM | POA: Diagnosis not present

## 2024-04-02 NOTE — Patient Instructions (Addendum)
 Medication Instructions:  Your physician recommends that you continue on your current medications as directed. Please refer to the Current Medication list given to you today.  *If you need a refill on your cardiac medications before your next appointment, please call your pharmacy*  Lab Work: No labs ordered today  If you have labs (blood work) drawn today and your tests are completely normal, you will receive your results only by: MyChart Message (if you have MyChart) OR A paper copy in the mail If you have any lab test that is abnormal or we need to change your treatment, we will call you to review the results.  Testing/Procedures: No test ordered today   Follow-Up: At [facility], you and your health needs are our priority.  As part of our continuing mission to provide you with exceptional heart care, our providers are all part of one team.  This team includes your primary Cardiologist (physician) and Advanced Practice Providers or APPs (Physician Assistants and Nurse Practitioners) who all work together to provide you with the care you need, when you need it.  Your next appointment:    12 months or as needed  Provider:   Caron Poser, MD   We recommend signing up for the patient portal called MyChart.  Sign up information is provided on this After Visit Summary.  MyChart is used to connect with patients for Virtual Visits (Telemedicine).  Patients are able to view lab/test results, encounter notes, upcoming appointments, etc.  Non-urgent messages can be sent to your provider as well.   To learn more about what you can do with MyChart, go to [email or web address].

## 2024-04-03 ENCOUNTER — Ambulatory Visit (INDEPENDENT_AMBULATORY_CARE_PROVIDER_SITE_OTHER): Admitting: Family

## 2024-04-03 ENCOUNTER — Encounter: Payer: Self-pay | Admitting: Family

## 2024-04-03 VITALS — BP 130/78 | HR 72 | Temp 97.8°F | Resp 20 | Ht 69.0 in | Wt 253.2 lb

## 2024-04-03 DIAGNOSIS — R911 Solitary pulmonary nodule: Secondary | ICD-10-CM | POA: Diagnosis not present

## 2024-04-03 DIAGNOSIS — M169 Osteoarthritis of hip, unspecified: Secondary | ICD-10-CM | POA: Diagnosis not present

## 2024-04-03 DIAGNOSIS — G479 Sleep disorder, unspecified: Secondary | ICD-10-CM | POA: Diagnosis not present

## 2024-04-03 DIAGNOSIS — M25552 Pain in left hip: Secondary | ICD-10-CM

## 2024-04-03 MED ORDER — TRAZODONE HCL 50 MG PO TABS: 25.0000 mg | ORAL_TABLET | Freq: Every day | ORAL | 3 refills | Status: DC

## 2024-04-03 NOTE — Patient Instructions (Addendum)
 Trial of trazodone    Hip Bursitis Rehab Ask your health care provider which exercises are safe for you. Do exercises exactly as told by your health care provider and adjust them as directed. It is normal to feel mild stretching, pulling, tightness, or discomfort as you do these exercises. Stop right away if you feel sudden pain or your pain gets worse. Do not begin these exercises until told by your health care provider. Stretching exercise This exercise warms up your muscles and joints and improves the movement and flexibility of your hip. This exercise also helps to relieve pain and stiffness. Iliotibial band stretch An iliotibial band is a strong band of muscle tissue that runs from the outer side of your hip to the outer side of your thigh and knee. Lie on your side with your left / right leg in the top position. Bend your left / right knee and grab your ankle. Stretch out your bottom arm to help you balance. Slowly bring your knee back so your thigh is slightly behind your body. Slowly lower your knee toward the floor until you feel a gentle stretch on the outside of your left / right thigh. If you do not feel a stretch and your knee will not lower more toward the floor, place the heel of your other foot on top of your knee and pull your knee down toward the floor with your foot. Hold this position for __________ seconds. Slowly return to the starting position. Repeat __________ times. Complete this exercise __________ times a day. Strengthening exercises These exercises build strength and endurance in your hip and pelvis. Endurance is the ability to use your muscles for a long time, even after they get tired. Bridge This exercise strengthens the muscles that move your thigh backward (hip extensors). Lie on your back on a firm surface with your knees bent and your feet flat on the floor. Tighten your buttocks muscles and lift your buttocks off the floor until your trunk is level with your  thighs. Do not arch your back. You should feel the muscles working in your buttocks and the back of your thighs. If you do not feel these muscles, slide your feet 1-2 inches (2.5-5 cm) farther away from your buttocks. If this exercise is too easy, try doing it with your arms crossed over your chest. Hold this position for __________ seconds. Slowly lower your hips to the starting position. Let your muscles relax completely after each repetition. Repeat __________ times. Complete this exercise __________ times a day. Squats This exercise strengthens the muscles in front of your thigh and knee (quadriceps). Stand in front of a table, with your feet and knees pointing straight ahead. You may rest your hands on the table for balance but not for support. Slowly bend your knees and lower your hips like you are going to sit in a chair. Keep your weight over your heels, not over your toes. Keep your lower legs upright so they are parallel with the table legs. Do not let your hips go lower than your knees. Do not bend lower than told by your health care provider. If your hip pain increases, do not bend as low. Hold the squat position for __________ seconds. Slowly push with your legs to return to standing. Do not use your hands to pull yourself to standing. Repeat __________ times. Complete this exercise __________ times a day. Hip hike  Stand sideways on a bottom step. Stand on your left / right leg with your  other foot unsupported next to the step. You can hold on to the railing or wall for balance if needed. Keep your knees straight and your torso square. Then lift your left / right hip up toward the ceiling. Hold this position for __________ seconds. Slowly let your left / right hip lower toward the floor, past the starting position. Your foot should get closer to the floor. Do not lean or bend your knees. Repeat __________ times. Complete this exercise __________ times a day. Single leg  stand This exercise increases your balance. Without shoes, stand near a railing or in a doorway. You may hold on to the railing or door frame as needed for balance. Squeeze your left / right buttock muscles, then lift up your other foot. Do not let your left / right hip push out to the side. It is helpful to stand in front of a mirror for this exercise so you can watch your hip. Hold this position for __________ seconds. Repeat __________ times. Complete this exercise __________ times a day. This information is not intended to replace advice given to you by your health care provider. Make sure you discuss any questions you have with your health care provider. Document Revised: 06/21/2021 Document Reviewed: 06/21/2021 Elsevier Patient Education  2024 [employer]Low Back Sprain or Strain Rehab Ask your health care provider which exercises are safe for you. Do exercises exactly as told by your health care provider and adjust them as directed. It is normal to feel mild stretching, pulling, tightness, or discomfort as you do these exercises. Stop right away if you feel sudden pain or your pain gets worse. Do not begin these exercises until told by your health care provider. Stretching and range-of-motion exercises These exercises warm up your muscles and joints and improve the movement and flexibility of your back. These exercises also help to relieve pain, numbness, and tingling. Lumbar rotation  Lie on your back on a firm bed or the floor with your knees bent. Straighten your arms out to your sides so each arm forms a 90-degree angle (right angle) with a side of your body. Slowly move (rotate) both of your knees to one side of your body until you feel a stretch in your lower back (lumbar). Try not to let your shoulders lift off the floor. Hold this position for __________ seconds. Tense your abdominal muscles and slowly move your knees back to the starting position. Repeat this exercise on the  other side of your body. Repeat __________ times. Complete this exercise __________ times a day. Single knee to chest  Lie on your back on a firm bed or the floor with both legs straight. Bend one of your knees. Use your hands to move your knee up toward your chest until you feel a gentle stretch in your lower back and buttock. Hold your leg in this position by holding on to the front of your knee. Keep your other leg as straight as possible. Hold this position for __________ seconds. Slowly return to the starting position. Repeat with your other leg. Repeat __________ times. Complete this exercise __________ times a day. Prone extension on elbows  Lie on your abdomen on a firm bed or the floor (prone position). Prop yourself up on your elbows. Use your arms to help lift your chest up until you feel a gentle stretch in your abdomen and your lower back. This will place some of your body weight on your elbows. If this is uncomfortable, try stacking pillows  under your chest. Your hips should stay down, against the surface that you are lying on. Keep your hip and back muscles relaxed. Hold this position for __________ seconds. Slowly relax your upper body and return to the starting position. Repeat __________ times. Complete this exercise __________ times a day. Strengthening exercises These exercises build strength and endurance in your back. Endurance is the ability to use your muscles for a long time, even after they get tired. Pelvic tilt This exercise strengthens the muscles that lie deep in the abdomen. Lie on your back on a firm bed or the floor with your legs extended. Bend your knees so they are pointing toward the ceiling and your feet are flat on the floor. Tighten your lower abdominal muscles to press your lower back against the floor. This motion will tilt your pelvis so your tailbone points up toward the ceiling instead of pointing to your feet or the floor. To help with this  exercise, you may place a small towel under your lower back and try to push your back into the towel. Hold this position for __________ seconds. Let your muscles relax completely before you repeat this exercise. Repeat __________ times. Complete this exercise __________ times a day. Alternating arm and leg raises  Get on your hands and knees on a firm surface. If you are on a hard floor, you may want to use padding, such as an exercise mat, to cushion your knees. Line up your arms and legs. Your hands should be directly below your shoulders, and your knees should be directly below your hips. Lift your left leg behind you. At the same time, raise your right arm and straighten it in front of you. Do not lift your leg higher than your hip. Do not lift your arm higher than your shoulder. Keep your abdominal and back muscles tight. Keep your hips facing the ground. Do not arch your back. Keep your balance carefully, and do not hold your breath. Hold this position for __________ seconds. Slowly return to the starting position. Repeat with your right leg and your left arm. Repeat __________ times. Complete this exercise __________ times a day. Abdominal set with straight leg raise  Lie on your back on a firm bed or the floor. Bend one of your knees and keep your other leg straight. Tense your abdominal muscles and lift your straight leg up, 4-6 inches (10-15 cm) off the ground. Keep your abdominal muscles tight and hold this position for __________ seconds. Do not hold your breath. Do not arch your back. Keep it flat against the ground. Keep your abdominal muscles tense as you slowly lower your leg back to the starting position. Repeat with your other leg. Repeat __________ times. Complete this exercise __________ times a day. Single leg lower with bent knees Lie on your back on a firm bed or the floor. Tense your abdominal muscles and lift your feet off the floor, one foot at a time, so  your knees and hips are bent in 90-degree angles (right angles). Your knees should be over your hips and your lower legs should be parallel to the floor. Keeping your abdominal muscles tense and your knee bent, slowly lower one of your legs so your toe touches the ground. Lift your leg back up to return to the starting position. Do not hold your breath. Do not let your back arch. Keep your back flat against the ground. Repeat with your other leg. Repeat __________ times. Complete this exercise __________  times a day. Posture and body mechanics Good posture and healthy body mechanics can help to relieve stress in your body's tissues and joints. Body mechanics refers to the movements and positions of your body while you do your daily activities. Posture is part of body mechanics. Good posture means: Your spine is in its natural S-curve position (neutral). Your shoulders are pulled back slightly. Your head is not tipped forward (neutral). Follow these guidelines to improve your posture and body mechanics in your everyday activities. Standing  When standing, keep your spine neutral and your feet about hip-width apart. Keep a slight bend in your knees. Your ears, shoulders, and hips should line up. When you do a task in which you stand in one place for a long time, place one foot up on a stable object that is 2-4 inches (5-10 cm) high, such as a footstool. This helps keep your spine neutral. Sitting  When sitting, keep your spine neutral and keep your feet flat on the floor. Use a footrest, if necessary, and keep your thighs parallel to the floor. Avoid rounding your shoulders, and avoid tilting your head forward. When working at a desk or a computer, keep your desk at a height where your hands are slightly lower than your elbows. Slide your chair under your desk so you are close enough to maintain good posture. When working at a computer, place your monitor at a height where you are looking  straight ahead and you do not have to tilt your head forward or downward to look at the screen. Resting When lying down and resting, avoid positions that are most painful for you. If you have pain with activities such as sitting, bending, stooping, or squatting, lie in a position in which your body does not bend very much. For example, avoid curling up on your side with your arms and knees near your chest (fetal position). If you have pain with activities such as standing for a long time or reaching with your arms, lie with your spine in a neutral position and bend your knees slightly. Try the following positions: Lying on your side with a pillow between your knees. Lying on your back with a pillow under your knees. Lifting  When lifting objects, keep your feet at least shoulder-width apart and tighten your abdominal muscles. Bend your knees and hips and keep your spine neutral. It is important to lift using the strength of your legs, not your back. Do not lock your knees straight out. Always ask for help to lift heavy or awkward objects. This information is not intended to replace advice given to you by your health care provider. Make sure you discuss any questions you have with your health care provider. Document Revised: 11/12/2022 Document Reviewed: 09/26/2020 Elsevier Patient Education  2024 [employer].

## 2024-04-03 NOTE — Progress Notes (Signed)
 Assessment & Plan:  Trouble in sleeping Assessment & Plan: Trial of trazodone  50 mg, starting with 25 mg and increasing to 50 mg as needed nightly.   Orders: -     traZODone  HCl; Take 0.5-1 tablets (25-50 mg total) by mouth at bedtime.  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 Order a repeat chest CT in one year to monitor the nodule. Advise reporting new pulmonary symptoms immediately.   Orders: -     CT CHEST WO CONTRAST; Future  Pain of left hip Assessment & Plan: Left hip pain likely due to trochanteric bursitis versus osteoarthritis .  Advise exercise variation to reduce hip stress, suggesting a stationary bike or elliptical. Provide exercises for low back and hip stretching/strengthening. Advised NSAIDs with food for short-term pain, caution against long-term high doses. He declines imaging today and will let me know if symptom persists.      Return precautions given.   Risks, benefits, and alternatives of the medications and treatment plan prescribed today were discussed, and patient expressed understanding.   Education regarding symptom management and diagnosis given to patient on AVS either electronically or printed.  Return in about 6 months (around 10/01/2024).  [name], FNP  Subjective:    Patient ID: Tanda LITTIE Moore, male    DOB: 1951/02/03, 73 y.o.   MRN: [id number]  CC: FARRIS GEIMAN is a 73 y.o. male who presents today for follow up.   HPI: HPI Here to Discuss CT chest without contrast   Discussed the use of AI scribe software for clinical note transcription with the patient, who gave verbal consent to proceed.  History of Present Illness   Davarion Cuffee is a 73 year old male who presents for follow-up regarding a CT scan of the chest.  A recent CT scan of the chest revealed a  small lung nodule. He has no history of smoking, pneumonia, or significant respiratory issues, although he had COVID-19 in 2020. He worked in environments with potential asbestos exposure but wore a mask during such activities. No chest pain, pressure, or shortness of breath. He was previously active, walking two miles daily until hip pain began a month ago.  He has a history of neck issues, having undergone two surgeries, the last one in 2007. Recently, he experienced severe back pain, which was initially thought to be a pulled muscle. An x-ray suggested a possible fractured screw from his neck surgery, but he  confirmed to be an old screw intentionally left in place by Dr Louis. The back pain has since resolved.  He experiences ongoing sleep disturbances.  He has tried various over-the-counter sleep aids without success, often experiencing next-day drowsiness. He is not currently taking gabapentin .  He experiences hip pain, particularly in the morning, located on the lateral side near the bone. The pain has limited his ability to walk long distances, but he finds some relief by placing a pillow between his knees while sleeping. No groin pain, numbness, or radiation of pain down the leg.              Neck pain has resolved. Consult with Dr Louis, 01/30/24  Consult with cardiology yesterday, Dr Argentina.  No further testing due to 8 symptomatic status.  Recommended goal LDL less than 70.  Continue aspirin 81 mg daily, simvastatin  40 mg daily.  Discussed Zetia or Repatha.  CT chest without contrast 03/02/2024 with multiple heavily calcified right lymph nodes consistent with prior granulomatous disease.  Started on gabapentin  100 mg TID 01/28/2024 for neck pain. Allergies: Patient has no known allergies. Current Outpatient Medications on File Prior to Visit  Medication Sig Dispense Refill   amLODipine  (NORVASC ) 5 MG tablet Take 1 tablet (5 mg total) by mouth daily. 90 tablet 3   aspirin EC 81 MG  tablet Take 81 mg by mouth daily. Swallow whole.     colchicine  0.6 MG tablet TAKE 1.2 MG (2 TABLETS) AT THE FIRST SIGN OF A GOUT FLARE, THEN TAKE 0.6 MG (1 TABLET) ONE HOUR LATER, ON DAY 2 AND AFTER TAKE 0.6 MG (1 TABLET) TWICE DAILY UNTIL 48 HOURS AFTER RESOLUTION OF SYMPTOMS. (Patient taking differently: Take 0.6 mg by mouth as needed. Take 1.2 mg (2 tablets) at the first sign of a gout flare, then take 0.6 mg (1 tablet) one hour later, on day 2 and after take 0.6 mg (1 tablet) twice daily until 48 hours after resolution of symptoms.) 90 tablet 1   ibuprofen (ADVIL,MOTRIN) 200 MG tablet Take 200 mg by mouth as needed.      Multiple Vitamins-Minerals (MENS ONE DAILY PO) Take by mouth.     Omega-3 Fatty Acids (FISH OIL) 1200 MG CAPS Take 1,200 mg by mouth. (Patient taking differently: Take 1,000 mg by mouth daily.)     simvastatin  (ZOCOR ) 40 MG tablet TAKE 1 TABLET BY MOUTH EVERY DAY (Patient taking differently: Take 20 mg by mouth. TAKE 1 TABLET BY MOUTH EVERY DAY) 90 tablet 3   STUDY - ARCADIA - aspirin 81mg  or placebo (PI - Sethi) Take 81 mg by mouth daily.     tamsulosin  (FLOMAX ) 0.4 MG CAPS capsule Take 1 capsule (0.4 mg total) by mouth daily. 30 capsule 0   baclofen  (LIORESAL ) 10 MG tablet Take 1 tablet (10 mg total) by mouth at bedtime as needed for muscle spasms. (Patient not taking: Reported on 04/02/2024) 10 each 0   No current facility-administered medications on file prior to visit.    Review of Systems  Constitutional:  Negative for chills and fever.  Respiratory:  Negative for cough.   Cardiovascular:  Negative for chest pain and palpitations.  Gastrointestinal:  Negative for nausea and vomiting.  Musculoskeletal:  Positive for back pain.  Neurological:  Negative for numbness.  Psychiatric/Behavioral:  Positive for sleep disturbance.       Objective:    BP 130/78   Pulse 72   Temp 97.8 F (36.6 C)   Resp 20   Ht 5' 9 (1.753 m)   Wt 253 lb 4 oz (114.9 kg)   SpO2 98%    BMI 37.40 kg/m  BP Readings from Last 3 Encounters:  04/03/24 130/78  04/02/24 126/78  01/10/24 (!) 162/97   Wt Readings from Last 3 Encounters:  04/03/24 253 lb 4 oz (114.9 kg)  04/02/24 253 lb (114.8 kg)  01/28/24 253 lb 6.4 oz (114.9 kg)    Physical Exam Vitals reviewed.  Constitutional:      Appearance: He is well-developed.  Cardiovascular:     Rate and Rhythm: Regular rhythm.     Heart sounds: Normal heart sounds.  Pulmonary:     Effort: Pulmonary effort is normal. No respiratory distress.     Breath sounds: Normal breath sounds. No wheezing or rales.  Musculoskeletal:     Lumbar back: Tenderness (right lateral low back mild tenderness with palpation; no bony abnormality) present.  No swelling or spasms. Normal range of motion. Negative right straight leg raise test and negative left straight leg raise test.       Back:     Comments: Full range of motion with flexion, extension, lateral side bends. No pain, numbness, tingling elicited with single leg raise bilaterally. No rash.  Skin:    General: Skin is warm and dry.  Neurological:     Mental Status: He is alert.  Psychiatric:        Speech: Speech normal.        Behavior: Behavior normal.

## 2024-04-08 DIAGNOSIS — M25559 Pain in unspecified hip: Secondary | ICD-10-CM | POA: Insufficient documentation

## 2024-04-08 NOTE — Assessment & Plan Note (Addendum)
 Left hip pain likely due to trochanteric bursitis versus osteoarthritis .  Advise exercise variation to reduce hip stress, suggesting a stationary bike or elliptical. Provide exercises for low back and hip stretching/strengthening. Advised NSAIDs with food for short-term pain, caution against long-term high doses. He declines imaging today and will let me know if symptom persists.

## 2024-04-08 NOTE — Assessment & Plan Note (Signed)
 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 Order a repeat chest CT in one year to monitor the nodule. Advise reporting new pulmonary symptoms immediately.

## 2024-04-08 NOTE — Assessment & Plan Note (Signed)
 Trial of trazodone  50 mg, starting with 25 mg and increasing to 50 mg as needed nightly.

## 2024-04-11 ENCOUNTER — Emergency Department: Admission: EM | Admit: 2024-04-11 | Discharge: 2024-04-11 | Disposition: A | Discharge status: Home / Self Care

## 2024-04-11 ENCOUNTER — Emergency Department

## 2024-04-11 ENCOUNTER — Other Ambulatory Visit: Payer: Self-pay

## 2024-04-11 DIAGNOSIS — I10 Essential (primary) hypertension: Secondary | ICD-10-CM | POA: Diagnosis not present

## 2024-04-11 DIAGNOSIS — M7989 Other specified soft tissue disorders: Secondary | ICD-10-CM | POA: Diagnosis not present

## 2024-04-11 DIAGNOSIS — M79674 Pain in right toe(s): Secondary | ICD-10-CM | POA: Diagnosis present

## 2024-04-11 DIAGNOSIS — L03031 Cellulitis of right toe: Secondary | ICD-10-CM | POA: Diagnosis not present

## 2024-04-11 LAB — COMPREHENSIVE METABOLIC PANEL WITH GFR
ALT: 28 U/L (ref 0–44)
AST: 31 U/L (ref 15–41)
Albumin: 4.5 g/dL (ref 3.5–5.0)
Alkaline Phosphatase: 69 U/L (ref 38–126)
Anion gap: 10 (ref 5–15)
BUN: 19 mg/dL (ref 8–23)
CO2: 26 mmol/L (ref 22–32)
Calcium: 9.3 mg/dL (ref 8.9–10.3)
Chloride: 103 mmol/L (ref 98–111)
Creatinine, Ser: 0.81 mg/dL (ref 0.61–1.24)
GFR, Estimated: >60 mL/min (ref >60)
Glucose, Bld: 111 mg/dL — ABNORMAL HIGH (ref 70–99)
Potassium: 3.5 mmol/L (ref 3.5–5.1)
Sodium: 139 mmol/L (ref 135–145)
Total Bilirubin: 0.9 mg/dL (ref 0.0–1.2)
Total Protein: 7.5 g/dL (ref 6.5–8.1)

## 2024-04-11 LAB — LACTIC ACID, PLASMA: Lactic Acid, Venous: 1.5 mmol/L (ref 0.5–1.9)

## 2024-04-11 LAB — CBC WITH DIFFERENTIAL/PLATELET
Abs Immature Granulocytes: 0.03 K/uL (ref 0.00–0.07)
Basophils Absolute: 0 K/uL (ref 0.0–0.1)
Basophils Relative: 0 %
Eosinophils Absolute: 0.3 K/uL (ref 0.0–0.5)
Eosinophils Relative: 4 %
HCT: 40.3 % (ref 39.0–52.0)
Hemoglobin: 14.2 g/dL (ref 13.0–17.0)
Immature Granulocytes: 0 %
Lymphocytes Relative: 20 %
Lymphs Abs: 1.5 K/uL (ref 0.7–4.0)
MCH: 31.8 pg (ref 26.0–34.0)
MCHC: 35.2 g/dL (ref 30.0–36.0)
MCV: 90.4 fL (ref 80.0–100.0)
Monocytes Absolute: 0.8 K/uL (ref 0.1–1.0)
Monocytes Relative: 11 %
Neutro Abs: 4.8 K/uL (ref 1.7–7.7)
Neutrophils Relative %: 65 %
Platelets: 249 K/uL (ref 150–400)
RBC: 4.46 MIL/uL (ref 4.22–5.81)
RDW: 13.1 % (ref 11.5–15.5)
WBC: 7.4 K/uL (ref 4.0–10.5)
nRBC: 0 % (ref 0.0–0.2)

## 2024-04-11 MED ORDER — CEPHALEXIN 500 MG PO CAPS
500.0000 mg | ORAL_CAPSULE | Freq: Once | ORAL | Status: AC
  Administered 2024-04-11: 500 mg via ORAL
  Filled 2024-04-11: qty 1

## 2024-04-11 MED ORDER — CEPHALEXIN 500 MG PO CAPS: 500.0000 mg | ORAL_CAPSULE | Freq: Two times a day (BID) | ORAL | 0 refills | Status: DC

## 2024-04-11 MED ORDER — CEPHALEXIN 500 MG PO CAPS: 500.0000 mg | ORAL_CAPSULE | Freq: Two times a day (BID) | ORAL | 0 refills | Status: AC

## 2024-04-11 MED ORDER — MUPIROCIN 2 % EX OINT: 1.0000 | TOPICAL_OINTMENT | Freq: Two times a day (BID) | CUTANEOUS | 0 refills | Status: DC

## 2024-04-11 MED ORDER — MUPIROCIN 2 % EX OINT: 1.0000 | TOPICAL_OINTMENT | Freq: Two times a day (BID) | CUTANEOUS | 0 refills | Status: AC

## 2024-04-11 NOTE — Discharge Instructions (Addendum)
 Your evaluation in the emergency department was overall reassuring.  I believe you have a skin infection of your toe, and I prescribed you a topical antibiotic and an oral antibiotic to use over the next week to treat this.  Please follow-up with your primary care provider for reevaluation, and return to the emergency department with any new or worsening symptoms.

## 2024-04-11 NOTE — ED Triage Notes (Signed)
 Pt to ED for R great toe problem. Has been soaking in warm water and placing abx ointment and peroxide. Is a little red but no purulent drainage or necrosis noted. No hx DM. Pt unsure when it started but maybe around 1 week ago. He walks 2 miles/day.

## 2024-04-11 NOTE — ED Provider Notes (Signed)
 [facility] Provider Note    Event Date/Time   First MD Initiated Contact with Patient 04/11/24 2044     (approximate)   History   Toe Injury  Pt to ED for R great toe problem. Has been soaking in warm water and placing abx ointment and peroxide. Is a little red but no purulent drainage or necrosis noted. No hx DM. Pt unsure when it started but maybe around 1 week ago. He walks 2 miles/day.    HPI Jeffrey Romero is a 73 y.o. male PMH hypertension, hyperlipidemia presents for evaluation of right great toe pain -Present for about a week.  Notes this occurred after he had been using new shoes and was rubbing the end of his toe against it.  He is going to address tomorrow also wanted to get this checked out before hand. -Been having mild pain.  No fever.  Redness has not spread up onto his foot. -No open wound, no specific traumatic event -No known history of diabetes     Physical Exam   Triage Vital Signs: ED Triage Vitals  Encounter Vitals Group     BP 04/11/24 1840 (!) 152/96     Girls Systolic BP Percentile --      Girls Diastolic BP Percentile --      Boys Systolic BP Percentile --      Boys Diastolic BP Percentile --      Pulse Rate 04/11/24 1840 93     Resp 04/11/24 1840 20     Temp 04/11/24 1840 98.3 F (36.8 C)     Temp Source 04/11/24 1840 Oral     SpO2 04/11/24 1840 98 %     Weight 04/11/24 1837 250 lb (113.4 kg)     Height 04/11/24 1837 5' 9 (1.753 m)     Head Circumference --      Peak Flow --      Pain Score 04/11/24 1835 3     Pain Loc --      Pain Education --      Exclude from Growth Chart --     Most recent vital signs: Vitals:   04/11/24 1840  BP: (!) 152/96  Pulse: 93  Resp: 20  Temp: 98.3 F (36.8 C)  SpO2: 98%     General: Awake, no distress.  CV:  Good peripheral perfusion. Resp:  Normal effort. Abd:  No distention.  Other:  Right great toe with erythema on distal aspect, no area of fluctuance.  Able to  clearly see edges of nail.  No necrotic tissue, no crepitus.  No proximal streaking.  Able to flex and extend without difficulty, only mild distal swelling.    ED Results / Procedures / Treatments   Labs (all labs ordered are listed, but only abnormal results are displayed) Labs Reviewed  COMPREHENSIVE METABOLIC PANEL WITH GFR - Abnormal; Notable for the following components:      Result Value   Glucose, Bld 111 (*)    All other components within normal limits  LACTIC ACID, PLASMA  CBC WITH DIFFERENTIAL/PLATELET  LACTIC ACID, PLASMA     EKG  N/a   RADIOLOGY Radiology interpreted by myself and radiology report reviewed.  No acute pathology identified--no fracture, no evidence of osteomyelitis.    PROCEDURES:  Critical Care performed: No  Procedures   MEDICATIONS ORDERED IN ED: Medications  cephALEXin  (KEFLEX ) capsule 500 mg (has no administration in time range)     IMPRESSION / MDM /  ASSESSMENT AND PLAN / ED COURSE  I reviewed the triage vital signs and the nursing notes.                              DDX/MDM/AP: Differential diagnosis includes, but is not limited to, toe cellulitis, no evidence of ingrown toenail, doubt underlying fracture, doubt osteomyelitis.  Exam not consistent with paronychia.  Plan: - Basic labs at triage, unremarkable - X-ray right great toe --unremarkable - Keflex   Patient's presentation is most consistent with acute presentation with potential threat to life or bodily function.   ED course below.  Discharged with Keflex  and mupirocin .  Recommend Tylenol , Motrin as needed.  ED return precautions in place.  Plan for PMD follow-up.  Clinical Course as of 04/11/24 2211  Sat Apr 11, 2024  2159 CBC, CMP reviewed, unremarkable Lactate normal [MM]  2159 X-rays of right great toe reviewed by myself, no obvious pathology appreciated [MM]  2208 XR: IMPRESSION: Negative.   [MM]    Clinical Course User Index [MM] Clarine Ozell LABOR, MD      FINAL CLINICAL IMPRESSION(S) / ED DIAGNOSES   Final diagnoses:  Cellulitis of great toe, right     Rx / DC Orders   ED Discharge Orders          Ordered    cephALEXin  (KEFLEX ) 500 MG capsule  2 times daily        04/11/24 2211    mupirocin  ointment (BACTROBAN ) 2 %  2 times daily        04/11/24 2210             Note:  This document was prepared using Dragon voice recognition software and may include unintentional dictation errors.   Clarine Ozell LABOR, MD 04/11/24 2211

## 2024-04-15 ENCOUNTER — Encounter: Payer: Self-pay | Admitting: Family

## 2024-04-20 ENCOUNTER — Ambulatory Visit: Payer: Self-pay

## 2024-04-20 NOTE — Telephone Encounter (Signed)
 FYI Only or Action Required?: Action required by provider: clinical question for provider.  Patient was last seen in primary care on 04/03/2024 by Dineen Rollene MATSU, FNP.  Called Nurse Triage reporting Foot Swelling.  Symptoms began a week ago.  Interventions attempted: Prescription medications: Amlodipine .  Symptoms are: gradually worsening.  Triage Disposition: See Physician Within 24 Hours  Patient/caregiver understands and will follow disposition?: Yes  **Appt. Scheduled for 10/2; see note below**       Copied from CRM #[id number]. Topic: Clinical - Red Word Triage >> Apr 20, 2024 10:18 AM Jeffrey Romero wrote: [facility] that prompted transfer to Nurse Triage: Dealing w/swelling ( blood pressure meds were changed recently ).. Swollen feet and ankles Reason for Disposition  [1] MODERATE leg swelling (e.Romero., swelling extends up to knees) AND [2] new-onset or getting worse  Answer Assessment - Initial Assessment Questions 1. ONSET: When did the swelling start? (e.Romero., minutes, hours, days)     X 1 week   2. LOCATION: What part of the leg is swollen?  Are both legs swollen or just one leg?     BIL feet, ankles   3. SEVERITY: How bad is the swelling? (e.Romero., localized; mild, moderate, severe)     Moderate   4. REDNESS: Is there redness or signs of infection?     No   5. PAIN: Is the swelling painful to touch? If Yes, ask: How painful is it?   (Scale 1-10; mild, moderate or severe)     No   6. FEVER: Do you have a fever? If Yes, ask: What is it, how was it measured, and when did it start?      No   7. CAUSE: What do you think is causing the leg swelling?     Unknown, however B/P medications were changed x 1 month ago   8. MEDICAL HISTORY: Do you have a history of blood clots (e.Romero., DVT), cancer, heart failure, kidney disease, or liver failure?     No   9. RECURRENT SYMPTOM: Have you had leg swelling before? If Yes, ask: When was the last time? What  happened that time?     No   10. OTHER SYMPTOMS: Do you have any other symptoms? (e.Romero., chest pain, difficulty breathing) No   PCP advised patient  that amlodipine   medication would cause swelling, which has presented itself. He wants to start back on the Valsartan  if possible. Appt. Scheduled for 10/2 to follow up with PCP.  Protocols used: Leg Swelling and Edema-A-AH

## 2024-04-23 ENCOUNTER — Encounter: Payer: Self-pay | Admitting: Family

## 2024-04-23 ENCOUNTER — Ambulatory Visit: Admitting: Family

## 2024-04-23 VITALS — BP 118/80 | HR 78 | Temp 98.3°F | Ht 69.0 in | Wt 259.8 lb

## 2024-04-23 DIAGNOSIS — I10 Essential (primary) hypertension: Secondary | ICD-10-CM | POA: Diagnosis not present

## 2024-04-23 DIAGNOSIS — M25552 Pain in left hip: Secondary | ICD-10-CM

## 2024-04-23 DIAGNOSIS — Z23 Encounter for immunization: Secondary | ICD-10-CM | POA: Diagnosis not present

## 2024-04-23 NOTE — Patient Instructions (Addendum)
 If blood pressure were to above 140/80, you may take hydrochlorothiazide  12.5mg . I believe the last script was for 25mg  so this would mean you would BREAK IN HALF.   Monitor blood pressure at home and me 5-6 reading on separate days. Goal is less than 120/80, based on newest guidelines, however we certainly want to be less than 130/80;  if persistently higher, please make sooner follow up appointment so we can recheck you blood pressure and manage/ adjust medications.  For hip pain as discussed:  If your symptom do not improve, you may go to walk in orthopedic clinic. Information below:  [facility] [address]  Monday-Friday 8am-9pm Saturday and Sunday 9am- 9pm   [phone]    Managing Your Hypertension Hypertension, also called high blood pressure, is when the force of the blood pressing against the walls of the arteries is too strong. Arteries are blood vessels that carry blood from your heart throughout your body. Hypertension forces the heart to work harder to pump blood and may cause the arteries to become narrow or stiff. Understanding blood pressure readings A blood pressure reading includes a higher number over a lower number: The first, or top, number is called the systolic pressure. It is a measure of the pressure in your arteries as your heart beats. The second, or bottom number, is called the diastolic pressure. It is a measure of the pressure in your arteries as the heart relaxes. For most people, a normal blood pressure is below 120/80. Your personal target blood pressure may vary depending on your medical conditions, your age, and other factors. Blood pressure is classified into four stages. Based on your blood pressure reading, your health care provider may use the following stages to determine what type of treatment you need, if any. Systolic pressure and diastolic pressure are measured in a unit called millimeters of mercury (mmHg). Normal Systolic pressure:  below 120. Diastolic pressure: below 80. Elevated Systolic pressure: 120-129. Diastolic pressure: below 80. Hypertension stage 1 Systolic pressure: 130-139. Diastolic pressure: 80-89. Hypertension stage 2 Systolic pressure: 140 or above. Diastolic pressure: 90 or above. How can this condition affect me? Managing your hypertension is very important. Over time, hypertension can damage the arteries and decrease blood flow to parts of the body, including the brain, heart, and kidneys. Having untreated or uncontrolled hypertension can lead to: A heart attack. A stroke. A weakened blood vessel (aneurysm). Heart failure. Kidney damage. Eye damage. Memory and concentration problems. Vascular dementia. What actions can I take to manage this condition? Hypertension can be managed by making lifestyle changes and possibly by taking medicines. Your health care provider will help you make a plan to bring your blood pressure within a normal range. You may be referred for counseling on a healthy diet and physical activity. Nutrition  Eat a diet that is high in fiber and potassium, and low in salt (sodium), added sugar, and fat. An example eating plan is called the DASH diet. DASH stands for Dietary Approaches to Stop Hypertension. To eat this way: Eat plenty of fresh fruits and vegetables. Try to fill one-half of your plate at each meal with fruits and vegetables. Eat whole grains, such as whole-wheat pasta, brown rice, or whole-grain bread. Fill about one-fourth of your plate with whole grains. Eat low-fat dairy products. Avoid fatty cuts of meat, processed or cured meats, and poultry with skin. Fill about one-fourth of your plate with lean proteins such as fish, chicken without skin, beans,  eggs, and tofu. Avoid pre-made and processed foods. These tend to be higher in sodium, added sugar, and fat. Reduce your daily sodium intake. Many people with hypertension should eat less than 1,500 mg of sodium  a day. Lifestyle  Work with your health care provider to maintain a healthy body weight or to lose weight. Ask what an ideal weight is for you. Get at least 30 minutes of exercise that causes your heart to beat faster (aerobic exercise) most days of the week. Activities may include walking, swimming, or biking. Include exercise to strengthen your muscles (resistance exercise), such as weight lifting, as part of your weekly exercise routine. Try to do these types of exercises for 30 minutes at least 3 days a week. Do not use any products that contain nicotine or tobacco. These products include cigarettes, chewing tobacco, and vaping devices, such as e-cigarettes. If you need help quitting, ask your health care provider. Control any long-term (chronic) conditions you have, such as high cholesterol or diabetes. Identify your sources of stress and find ways to manage stress. This may include meditation, deep breathing, or making time for fun activities. Alcohol use Do not drink alcohol if: Your health care provider tells you not to drink. You are pregnant, may be pregnant, or are planning to become pregnant. If you drink alcohol: Limit how much you have to: 0-1 drink a day for women. 0-2 drinks a day for men. Know how much alcohol is in your drink. In the [place] one drink equals one 12 oz bottle of beer (355 mL), one 5 oz glass of wine (148 mL), or one 1 oz glass of hard liquor (44 mL). Medicines Your health care provider may prescribe medicine if lifestyle changes are not enough to get your blood pressure under control and if: Your systolic blood pressure is 130 or higher. Your diastolic blood pressure is 80 or higher. Take medicines only as told by your health care provider. Follow the directions carefully. Blood pressure medicines must be taken as told by your health care provider. The medicine does not work as well when you skip doses. Skipping doses also puts you at risk for  problems. Monitoring Before you monitor your blood pressure: Do not smoke, drink caffeinated beverages, or exercise within 30 minutes before taking a measurement. Use the bathroom and empty your bladder (urinate). Sit quietly for at least 5 minutes before taking measurements. Monitor your blood pressure at home as told by your health care provider. To do this: Sit with your back straight and supported. Place your feet flat on the floor. Do not cross your legs. Support your arm on a flat surface, such as a table. Make sure your upper arm is at heart level. Each time you measure, take two or three readings one minute apart and record the results. You may also need to have your blood pressure checked regularly by your health care provider. General information Talk with your health care provider about your diet, exercise habits, and other lifestyle factors that may be contributing to hypertension. Review all the medicines you take with your health care provider because there may be side effects or interactions. Keep all follow-up visits. Your health care provider can help you create and adjust your plan for managing your high blood pressure. Where to find more information National Heart, Lung, and Blood Institute: PopSte[employer].is American Heart Association: www.heart.org Contact a health care provider if: You think you are having a reaction to medicines you have taken. You have  repeated (recurrent) headaches. You feel dizzy. You have swelling in your ankles. You have trouble with your vision. Get help right away if: You develop a severe headache or confusion. You have unusual weakness or numbness, or you feel faint. You have severe pain in your chest or abdomen. You vomit repeatedly. You have trouble breathing. These symptoms may be an emergency. Get help right away. Call 911. Do not wait to see if the symptoms will go away. Do not drive yourself to the hospital. Summary Hypertension  is when the force of blood pumping through your arteries is too strong. If this condition is not controlled, it may put you at risk for serious complications. Your personal target blood pressure may vary depending on your medical conditions, your age, and other factors. For most people, a normal blood pressure is less than 120/80. Hypertension is managed by lifestyle changes, medicines, or both. Lifestyle changes to help manage hypertension include losing weight, eating a healthy, low-sodium diet, exercising more, stopping smoking, and limiting alcohol. This information is not intended to replace advice given to you by your health care provider. Make sure you discuss any questions you have with your health care provider. Document Revised: 03/23/2021 Document Reviewed: 03/23/2021 Elsevier Patient Education  2024 [employer].

## 2024-04-23 NOTE — Assessment & Plan Note (Signed)
 Chronic, unchanged. Discussing hip and lumbar xrays in clinic today however patient and I jointly agreed that he may benefit from corticosteroid injection of which he can likely have with evaluation, imaging with [facility]; he understands he may go to their walk in clinic at anytime.

## 2024-04-23 NOTE — Progress Notes (Unsigned)
 Assessment & Plan:  Need for influenza vaccination -     Flu vaccine HIGH DOSE PF(Fluzone Trivalent)  Primary hypertension Assessment & Plan: Blood pressure excellent today without amlodipine  5mg . Leg swelling resolved. Previously done well with hydrochlorothiazide  ; advised to use PRN BP > 140/80 while he keeps BP log at home. He will let me know how is doing   Pain of left hip Assessment & Plan: Chronic, unchanged. Discussing hip and lumbar xrays in clinic today however patient and I jointly agreed that he may benefit from corticosteroid injection of which he can likely have with evaluation, imaging with [facility]; he understands he may go to their walk in clinic at anytime.       Return precautions given.   Risks, benefits, and alternatives of the medications and treatment plan prescribed today were discussed, and patient expressed understanding.   Education regarding symptom management and diagnosis given to patient on AVS either electronically or printed.  Return in about 6 months (around 10/22/2024).  Jeffrey Northern, FNP  Subjective:    Patient ID: Jeffrey Romero, male    DOB: 08/29/1950, 73 y.o.   MRN: [id number]  CC: [name] is a 73 y.o. male who presents today for an acute visit.    HPI: Complains of BL leg swelling x one week ago, resolved after he stopped amlodipine .   Denies orthopnea, sob, cp.   Left hip pain remains episodic. Dull pain. Worse with walking, standing. Resolves with sitting.      H/o gout Previously on valsartan  ( caused GI side effects), hctz Allergies: Patient has no known allergies. Current Outpatient Medications on File Prior to Visit  Medication Sig Dispense Refill   aspirin EC 81 MG tablet Take 81 mg by mouth daily. Swallow whole.     hydrochlorothiazide  (MICROZIDE ) 12.5 MG capsule Take 12.5 mg by mouth daily as needed (if BP > 140/80).     ibuprofen (ADVIL,MOTRIN) 200 MG tablet Take 200 mg by mouth as needed.       Multiple Vitamins-Minerals (MENS ONE DAILY PO) Take by mouth.     STUDY - ARCADIA - aspirin 81mg  or placebo (PI - Sethi) Take 81 mg by mouth daily.     tamsulosin  (FLOMAX ) 0.4 MG CAPS capsule Take 1 capsule (0.4 mg total) by mouth daily. 30 capsule 0   traZODone  (DESYREL ) 50 MG tablet Take 0.5-1 tablets (25-50 mg total) by mouth at bedtime. 30 tablet 3   colchicine  0.6 MG tablet TAKE 1.2 MG (2 TABLETS) AT THE FIRST SIGN OF A GOUT FLARE, THEN TAKE 0.6 MG (1 TABLET) ONE HOUR LATER, ON DAY 2 AND AFTER TAKE 0.6 MG (1 TABLET) TWICE DAILY UNTIL 48 HOURS AFTER RESOLUTION OF SYMPTOMS. (Patient not taking: Reported on 04/23/2024) 90 tablet 1   Omega-3 Fatty Acids (FISH OIL) 1200 MG CAPS Take 1,200 mg by mouth. (Patient not taking: Reported on 04/23/2024)     simvastatin  (ZOCOR ) 40 MG tablet TAKE 1 TABLET BY MOUTH EVERY DAY (Patient not taking: Reported on 04/23/2024) 90 tablet 3   No current facility-administered medications on file prior to visit.    Review of Systems  Constitutional:  Negative for chills and fever.  Respiratory:  Negative for cough and shortness of breath.   Cardiovascular:  Negative for chest pain, palpitations and leg swelling.  Gastrointestinal:  Negative for nausea and vomiting.      Objective:    BP 118/80   Pulse 78   Temp 98.3 F (36.8 C) (  Oral)   Ht 5' 9 (1.753 m)   Wt 259 lb 12.8 oz (117.8 kg)   SpO2 97%   BMI 38.37 kg/m   BP Readings from Last 3 Encounters:  04/23/24 118/80  04/11/24 (!) 168/87  04/03/24 130/78   Wt Readings from Last 3 Encounters:  04/23/24 259 lb 12.8 oz (117.8 kg)  04/11/24 250 lb (113.4 kg)  04/03/24 253 lb 4 oz (114.9 kg)    Physical Exam Vitals reviewed.  Constitutional:      Appearance: He is well-developed.  Cardiovascular:     Rate and Rhythm: Regular rhythm.     Heart sounds: Normal heart sounds.  Pulmonary:     Effort: Pulmonary effort is normal. No respiratory distress.     Breath sounds: Normal breath sounds. No  wheezing, rhonchi or rales.  Musculoskeletal:     Right lower leg: No edema.     Left lower leg: No edema.  Skin:    General: Skin is warm and dry.  Neurological:     Mental Status: He is alert.  Psychiatric:        Speech: Speech normal.        Behavior: Behavior normal.

## 2024-04-23 NOTE — Assessment & Plan Note (Signed)
 Blood pressure excellent today without amlodipine  5mg . Leg swelling resolved. Previously done well with hydrochlorothiazide  ; advised to use PRN BP > 140/80 while he keeps BP log at home. He will let me know how is doing

## 2024-04-24 DIAGNOSIS — M7062 Trochanteric bursitis, left hip: Secondary | ICD-10-CM | POA: Diagnosis not present

## 2024-04-26 ENCOUNTER — Other Ambulatory Visit: Payer: Self-pay | Admitting: Family

## 2024-04-26 DIAGNOSIS — G479 Sleep disorder, unspecified: Secondary | ICD-10-CM

## 2024-05-05 DIAGNOSIS — M79671 Pain in right foot: Secondary | ICD-10-CM | POA: Diagnosis not present

## 2024-05-05 DIAGNOSIS — L600 Ingrowing nail: Secondary | ICD-10-CM | POA: Diagnosis not present

## 2024-05-05 DIAGNOSIS — L03031 Cellulitis of right toe: Secondary | ICD-10-CM | POA: Diagnosis not present

## 2024-05-06 DIAGNOSIS — M7062 Trochanteric bursitis, left hip: Secondary | ICD-10-CM | POA: Diagnosis not present

## 2024-05-06 DIAGNOSIS — M1612 Unilateral primary osteoarthritis, left hip: Secondary | ICD-10-CM | POA: Diagnosis not present

## 2024-05-06 DIAGNOSIS — M25552 Pain in left hip: Secondary | ICD-10-CM | POA: Diagnosis not present

## 2024-05-11 ENCOUNTER — Ambulatory Visit

## 2024-05-15 DIAGNOSIS — M25552 Pain in left hip: Secondary | ICD-10-CM | POA: Diagnosis not present

## 2024-05-15 DIAGNOSIS — M7062 Trochanteric bursitis, left hip: Secondary | ICD-10-CM | POA: Diagnosis not present

## 2024-05-15 DIAGNOSIS — M1612 Unilateral primary osteoarthritis, left hip: Secondary | ICD-10-CM | POA: Diagnosis not present

## 2024-05-21 ENCOUNTER — Telehealth: Payer: Self-pay | Admitting: Family

## 2024-05-21 NOTE — Telephone Encounter (Signed)
 close

## 2024-05-26 ENCOUNTER — Ambulatory Visit (INDEPENDENT_AMBULATORY_CARE_PROVIDER_SITE_OTHER): Admitting: *Deleted

## 2024-05-26 VITALS — Ht 69.0 in | Wt 250.0 lb

## 2024-05-26 DIAGNOSIS — Z0000 Encounter for general adult medical examination without abnormal findings: Secondary | ICD-10-CM | POA: Diagnosis not present

## 2024-05-26 NOTE — Progress Notes (Signed)
 Subjective:   Jeffrey Romero is a 73 y.o. male who presents for a Medicare Annual Wellness Visit.  Allergies (verified) Patient has no known allergies.   History: Past Medical History:  Diagnosis Date   BPH (benign prostatic hyperplasia) 03/01/2016   EXTERNAL HEMORRHOIDS WITHOUT MENTION COMP    HYPERLIPIDEMIA    HYPERTENSION    Osteoarthritis of knee    Prostate cancer [facility])    Past Surgical History:  Procedure Laterality Date   CYSTOSCOPY N/A 08/25/2018   Procedure: CYSTOSCOPY FLEXIBLE;  Surgeon: Sherrilee Belvie LITTIE, MD;  Location: [facility];  Service: Urology;  Laterality: N/A;  NO SEEDS FOUND IN BLADDER   JOINT REPLACEMENT  2008   Dual knee replacement   Neck fusion  05 & 07   PROSTATE BIOPSY     RADIOACTIVE SEED IMPLANT N/A 08/25/2018   Procedure: RADIOACTIVE SEED IMPLANT/BRACHYTHERAPY IMPLANT;  Surgeon: Sherrilee Belvie LITTIE, MD;  Location: [facility];  Service: Urology;  Laterality: N/A;   77 SEEDS FOUND IN BLADDER   right knee surgery  2006   SPACE OAR INSTILLATION N/A 08/25/2018   Procedure: SPACE OAR INSTILLATION;  Surgeon: Sherrilee Belvie LITTIE, MD;  Location: [facility];  Service: Urology;  Laterality: N/A;   TONSILLECTOMY AND ADENOIDECTOMY     TOTAL KNEE ARTHROPLASTY  08/2010   bilateral - olin   Family History  Adopted: Yes  Family history unknown: Yes   Social History   Occupational History   Not on file  Tobacco Use   Smoking status: Never   Smokeless tobacco: Never   Tobacco comments:    partially retired - computer work; 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  Vaping Use   Vaping status: Never Used  Substance and Sexual Activity   Alcohol use: Yes    Alcohol/week: 3.0 standard drinks of alcohol    Types: 3 Glasses of wine per week    Comment: Occasionally wine or beer    Drug use: Never   Sexual activity: Not Currently   Tobacco Counseling Counseling given: Not Answered Tobacco comments: partially  retired - computer work; 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  SDOH Screenings   Food Insecurity: No Food Insecurity (05/26/2024)  Housing: Low Risk  (05/26/2024)  Transportation Needs: No Transportation Needs (05/26/2024)  Utilities: Not At Risk (05/26/2024)  Alcohol Screen: Low Risk  (05/26/2024)  Depression (PHQ2-9): Low Risk  (05/26/2024)  Financial Resource Strain: Low Risk  (05/26/2024)  Physical Activity: Sufficiently Active (05/26/2024)  Recent Concern: Physical Activity - Inactive (05/25/2024)  Social Connections: Moderately Isolated (05/26/2024)  Stress: No Stress Concern Present (05/26/2024)  Tobacco Use: Low Risk  (05/26/2024)  Health Literacy: Adequate Health Literacy (05/26/2024)   Depression Screen    05/26/2024    9:49 AM 04/23/2024    9:33 AM 04/03/2024    9:35 AM 01/28/2024    9:58 AM 01/28/2024    9:56 AM 11/14/2023    2:04 PM 06/11/2023    9:12 AM  PHQ 2/9 Scores  PHQ - 2 Score 0 0 0 1 0 0 0  PHQ- 9 Score 0 0 4    0     Goals Addressed             This Visit's Progress    Patient Stated       Wants to try to continue walk        Visit info / Clinical Intake: Medicare Wellness Visit Type:: Subsequent Annual Wellness Visit Medicare Wellness Visit  Mode:: Video Interpreter Needed?: No Pre-visit prep was completed: yes AWV questionnaire completed by patient prior to visit?: no Living arrangements:: (!) lives alone Patient's Overall Health Status Rating: very good Typical amount of pain: some Does pain affect daily life?: no Are you currently prescribed opioids?: no  Dietary Habits and Nutritional Risks How many meals a day?: 3 Eats fruit and vegetables daily?: (!) no Most meals are obtained by: preparing own meals Diabetic:: no  Functional Status Activities of Daily Living (to include ambulation/medication): Independent Ambulation: Independent Medication Administration: Independent Home Management: Independent Manage your own finances?: yes Primary  transportation is: driving Concerns about vision?: no *vision screening is required for WTM* Concerns about hearing?: no  Fall Screening Falls in the past year?: 0 Number of falls in past year: 0 Was there an injury with Fall?: 0 Fall Risk Category Calculator: 0 Patient Fall Risk Level: Low Fall Risk  Fall Risk Patient at Risk for Falls Due to: No Fall Risks Fall risk Follow up: Falls evaluation completed; Falls prevention discussed  Home and Transportation Safety: All rugs have non-skid backing?: yes All stairs or steps have railings?: N/A, no stairs Grab bars in the bathtub or shower?: (!) no Have non-skid surface in bathtub or shower?: yes Good home lighting?: yes Regular seat belt use?: yes Hospital stays in the last year:: no  Cognitive Assessment Difficulty concentrating, remembering, or making decisions? : no Will 6CIT or Mini Cog be Completed: yes What year is it?: 0 points What month is it?: 0 points Give patient an address phrase to remember (5 components): [address] [place] what time is it?: 0 points Count backwards from 20 to 1: 0 points Say the months of the year in reverse: 0 points Repeat the address phrase from earlier: 0 points 6 CIT Score: 0 points  Advance Directives (For Healthcare) Does Patient Have a Medical Advance Directive?: Yes Type of Advance Directive: Healthcare Power of [place]; Living will Copy of Healthcare Power of Attorney in Chart?: No - copy requested Copy of Living Will in Chart?: No - copy requested  Reviewed/Updated  Reviewed/Updated: All        Objective:    Today's Vitals   05/26/24 0929  Weight: 250 lb (113.4 kg)  Height: 5' 9 (1.753 m)   Body mass index is 36.92 kg/m.  Current Medications (verified) Outpatient Encounter Medications as of 05/26/2024  Medication Sig   aspirin EC 81 MG tablet Take 81 mg by mouth daily. Swallow whole.   colchicine  0.6 MG tablet TAKE 1.2 MG (2 TABLETS) AT THE FIRST  SIGN OF A GOUT FLARE, THEN TAKE 0.6 MG (1 TABLET) ONE HOUR LATER, ON DAY 2 AND AFTER TAKE 0.6 MG (1 TABLET) TWICE DAILY UNTIL 48 HOURS AFTER RESOLUTION OF SYMPTOMS. (Patient taking differently: as needed. Take 1.2 mg (2 tablets) at the first sign of a gout flare, then take 0.6 mg (1 tablet) one hour later, on day 2 and after take 0.6 mg (1 tablet) twice daily until 48 hours after resolution of symptoms.)   gabapentin  (NEURONTIN ) 100 MG capsule Take 100 mg by mouth at bedtime.   hydrochlorothiazide  (MICROZIDE ) 12.5 MG capsule Take 12.5 mg by mouth daily as needed (if BP > 140/80). (Patient taking differently: Take 12.5 mg by mouth daily. Takes 1/2 daily)   ibuprofen (ADVIL,MOTRIN) 200 MG tablet Take 200 mg by mouth as needed.    Multiple Vitamins-Minerals (MENS ONE DAILY PO) Take by mouth.   Omega-3 Fatty Acids (FISH  OIL) 1200 MG CAPS Take 1,200 mg by mouth.   simvastatin  (ZOCOR ) 40 MG tablet TAKE 1 TABLET BY MOUTH EVERY DAY   tamsulosin  (FLOMAX ) 0.4 MG CAPS capsule Take 1 capsule (0.4 mg total) by mouth daily.   traZODone  (DESYREL ) 50 MG tablet TAKE 1/2 TO 1 TABLET (25 TO 50 MG TOTAL) BY MOUTH AT BEDTIME (Patient taking differently: at bedtime as needed.)   STUDY - ARCADIA - aspirin 81mg  or placebo (PI - Sethi) Take 81 mg by mouth daily.   No facility-administered encounter medications on file as of 05/26/2024.   Hearing/Vision screen Hearing Screening - Comments:: No issues Vision Screening - Comments:: Glasses  sees Dr. Laurice  up to date Immunizations and Health Maintenance Health Maintenance  Topic Date Due   COVID-19 Vaccine (4 - 2025-26 season) 03/23/2024   Medicare Annual Wellness (AWV)  05/26/2025   Fecal DNA (Cologuard)  11/11/2025   DTaP/Tdap/Td (4 - Td or Tdap) 12/22/2027   Pneumococcal Vaccine: 50+ Years  Completed   Influenza Vaccine  Completed   Hepatitis C Screening  Completed   Zoster Vaccines- Shingrix  Completed   Meningococcal B Vaccine  Aged Out        Assessment/Plan:   This is a routine wellness examination for [place].  Patient Care Team: Dineen Rollene MATSU, FNP as PCP - General (Family Medicine) Argentina Clap, MD as PCP - Cardiology (Cardiology) Ernie Cough, MD (Orthopedic Surgery) Louis Shove, MD as Consulting Physician (Neurosurgery)  I have personally reviewed and noted the following in the patient's chart:   Medical and social history Use of alcohol, tobacco or illicit drugs  Current medications and supplements including opioid prescriptions. Functional ability and status Nutritional status Physical activity Advanced directives List of other physicians Hospitalizations, surgeries, and ER visits in previous 12 months Vitals Screenings to include cognitive, depression, and falls Referrals and appointments  No orders of the defined types were placed in this encounter.  In addition, I have reviewed and discussed with patient certain preventive protocols, quality metrics, and best practice recommendations. A written personalized care plan for preventive services as well as general preventive health recommendations were provided to patient.   Angeline Fredericks, LPN   [id number]/[id number]   Return in 1 year (on 05/26/2025).  After Visit Summary: (MyChart) Due to this being a telephonic visit, the after visit summary with patients personalized plan was offered to patient via MyChart   Nurse Notes: Patient declines covid vaccine

## 2024-05-26 NOTE — Patient Instructions (Signed)
 [name],  Thank you for taking the time for your Medicare Wellness Visit. I appreciate your continued commitment to your health goals. Please review the care plan we discussed, and feel free to reach out if I can assist you further.  Please note that Annual Wellness Visits do not include a physical exam. Some assessments may be limited, especially if the visit was conducted virtually. If needed, we may recommend an in-person follow-up with your provider.  Ongoing Care Seeing your primary care provider every 3 to 6 months helps us  monitor your health and provide consistent, personalized care.  Consider update your covid vaccine  Referrals If a referral was made during today's visit and you haven't received any updates within two weeks, please contact the referred provider directly to check on the status.  Recommended Screenings:  Health Maintenance  Topic Date Due   COVID-19 Vaccine (4 - 2025-26 season) 03/23/2024   Medicare Annual Wellness Visit  05/26/2025   Cologuard (Stool DNA test)  11/11/2025   DTaP/Tdap/Td vaccine (4 - Td or Tdap) 12/22/2027   Pneumococcal Vaccine for age over 26  Completed   Flu Shot  Completed   Hepatitis C Screening  Completed   Zoster (Shingles) Vaccine  Completed   Meningitis B Vaccine  Aged Out       05/26/2024    9:37 AM  Advanced Directives  Does Patient Have a Medical Advance Directive? Yes  Type of Estate Agent of Jeffrey Romero;Living will  Copy of Healthcare Power of Attorney in Chart? No - copy requested    Vision: Annual vision screenings are recommended for early detection of glaucoma, cataracts, and diabetic retinopathy. These exams can also reveal signs of chronic conditions such as diabetes and high blood pressure.  Dental: Annual dental screenings help detect early signs of oral cancer, gum disease, and other conditions linked to overall health, including heart disease and diabetes.  Please see the attached  documents for additional preventive care recommendations.

## 2024-05-27 NOTE — Progress Notes (Signed)
 Visit Complete: Virtual I connected with this patient by a audio enabled telemedicine application and verified that I am speaking with the correct person using two identifiers.  Patient Location: Home Provider Location: Home Office  I discussed the limitations of evaluation and management by telemedicine. The patient expressed understanding and agreed to proceed.  Persons Participating in Visit: Patient

## 2024-06-01 DIAGNOSIS — C61 Malignant neoplasm of prostate: Secondary | ICD-10-CM | POA: Diagnosis not present

## 2024-06-03 DIAGNOSIS — M5412 Radiculopathy, cervical region: Secondary | ICD-10-CM | POA: Diagnosis not present

## 2024-06-03 DIAGNOSIS — M546 Pain in thoracic spine: Secondary | ICD-10-CM | POA: Diagnosis not present

## 2024-06-04 DIAGNOSIS — C61 Malignant neoplasm of prostate: Secondary | ICD-10-CM | POA: Diagnosis not present

## 2024-06-04 DIAGNOSIS — R399 Unspecified symptoms and signs involving the genitourinary system: Secondary | ICD-10-CM | POA: Diagnosis not present

## 2024-06-05 NOTE — Progress Notes (Signed)
 I connected with  Jeffrey Romero on 05/26/2024 by a video and audio enabled telemedicine application and verified that I am speaking with the correct person using two identifiers.  Patient Location: Home  Provider Location: Office/Clinic  Persons Participating in Visit: Patient.  I discussed the limitations of evaluation and management by telemedicine. The patient expressed understanding and agreed to proceed.   Vital Signs: Because this visit was a virtual/telehealth visit, some criteria may be missing or patient reported. Any vitals not documented were not able to be obtained and vitals that have been documented are patient reported.

## 2024-06-05 NOTE — Progress Notes (Deleted)
 I connected with  Jeffrey Romero on 06/05/24 by a video and audio enabled telemedicine application and verified that I am speaking with the correct person using two identifiers.  Patient Location: Home  Provider Location: Office/Clinic  Persons Participating in Visit: Patient.  I discussed the limitations of evaluation and management by telemedicine. The patient expressed understanding and agreed to proceed.   Vital Signs: Because this visit was a virtual/telehealth visit, some criteria may be missing or patient reported. Any vitals not documented were not able to be obtained and vitals that have been documented are patient reported.

## 2024-06-08 ENCOUNTER — Other Ambulatory Visit: Payer: Self-pay | Admitting: Family

## 2024-06-08 DIAGNOSIS — M542 Cervicalgia: Secondary | ICD-10-CM

## 2024-06-10 NOTE — Telephone Encounter (Signed)
 Spoke to pt and he confirmed that he only takes  2 Gabapentin  100 mg each at night

## 2024-06-10 NOTE — Telephone Encounter (Unsigned)
 Copied from CRM [id number]. Topic: General - Other >> Jun 10, 2024 11:14 AM Laymon HERO wrote: Reason for CRM: Patient asking for Jordan, Jenate, CMA to return call when available.

## 2024-06-10 NOTE — Telephone Encounter (Signed)
 LVM to call back to discuss message below per Rollene  Please confirm that patient is on gabapentin  100mg  3 times daily. If he is , please provide a refill with 3 refills.

## 2024-07-02 DIAGNOSIS — M546 Pain in thoracic spine: Secondary | ICD-10-CM | POA: Diagnosis not present

## 2024-07-02 DIAGNOSIS — M5412 Radiculopathy, cervical region: Secondary | ICD-10-CM | POA: Diagnosis not present

## 2024-07-08 DIAGNOSIS — M546 Pain in thoracic spine: Secondary | ICD-10-CM | POA: Diagnosis not present

## 2024-07-08 DIAGNOSIS — M5412 Radiculopathy, cervical region: Secondary | ICD-10-CM | POA: Diagnosis not present

## 2024-07-14 DIAGNOSIS — M546 Pain in thoracic spine: Secondary | ICD-10-CM | POA: Diagnosis not present

## 2024-07-14 DIAGNOSIS — M5412 Radiculopathy, cervical region: Secondary | ICD-10-CM | POA: Diagnosis not present

## 2024-07-20 ENCOUNTER — Encounter: Payer: Self-pay | Admitting: Family

## 2024-07-21 ENCOUNTER — Other Ambulatory Visit: Payer: Self-pay | Admitting: Family

## 2024-07-21 ENCOUNTER — Encounter: Payer: Self-pay | Admitting: Family

## 2024-07-21 DIAGNOSIS — N400 Enlarged prostate without lower urinary tract symptoms: Secondary | ICD-10-CM

## 2024-07-21 MED ORDER — COLCHICINE 0.6 MG PO TABS: ORAL_TABLET | ORAL | 1 refills | Status: DC

## 2024-07-21 MED ORDER — TAMSULOSIN HCL 0.4 MG PO CAPS: 0.4000 mg | ORAL_CAPSULE | Freq: Every day | ORAL | 3 refills | Status: AC

## 2024-07-28 ENCOUNTER — Other Ambulatory Visit: Payer: Self-pay | Admitting: Family

## 2024-10-22 ENCOUNTER — Ambulatory Visit: Admitting: Family

## 2025-02-22 ENCOUNTER — Other Ambulatory Visit

## 2025-05-28 ENCOUNTER — Ambulatory Visit
